# Patient Record
Sex: Female | Born: 1985 | Hispanic: Yes | Marital: Single | Smoking: Never smoker
Health system: Southern US, Community
[De-identification: ages and names within clinical notes are randomized; demographics above are authoritative.]

---

## 2009-04-23 ENCOUNTER — Emergency Department (HOSPITAL_COMMUNITY): Admission: EM | Admit: 2009-04-23 | Discharge: 2009-04-23 | Payer: Self-pay | Admitting: Emergency Medicine

## 2009-04-25 ENCOUNTER — Emergency Department (HOSPITAL_COMMUNITY): Admission: EM | Admit: 2009-04-25 | Discharge: 2009-04-25 | Payer: Self-pay | Admitting: Emergency Medicine

## 2009-04-27 ENCOUNTER — Emergency Department (HOSPITAL_COMMUNITY): Admission: EM | Admit: 2009-04-27 | Discharge: 2009-04-27 | Payer: Self-pay | Admitting: Emergency Medicine

## 2009-05-06 ENCOUNTER — Emergency Department (HOSPITAL_COMMUNITY): Admission: EM | Admit: 2009-05-06 | Discharge: 2009-05-06 | Payer: Self-pay | Admitting: Emergency Medicine

## 2010-06-21 LAB — WOUND CULTURE

## 2010-06-21 LAB — URINALYSIS, ROUTINE W REFLEX MICROSCOPIC
Protein, ur: NEGATIVE mg/dL
Specific Gravity, Urine: 1.024 (ref 1.005–1.030)
pH: 6 (ref 5.0–8.0)

## 2010-06-21 LAB — URINE MICROSCOPIC-ADD ON

## 2010-06-21 LAB — PREGNANCY, URINE: Preg Test, Ur: NEGATIVE

## 2012-10-04 ENCOUNTER — Emergency Department (HOSPITAL_COMMUNITY)
Admission: EM | Admit: 2012-10-04 | Discharge: 2012-10-04 | Disposition: A | Discharge status: Home / Self Care | Payer: Self-pay | Attending: Emergency Medicine | Admitting: Emergency Medicine

## 2012-10-04 ENCOUNTER — Encounter (HOSPITAL_COMMUNITY): Payer: Self-pay | Admitting: Family Medicine

## 2012-10-04 ENCOUNTER — Emergency Department (HOSPITAL_COMMUNITY): Payer: Self-pay

## 2012-10-04 DIAGNOSIS — R109 Unspecified abdominal pain: Secondary | ICD-10-CM | POA: Insufficient documentation

## 2012-10-04 DIAGNOSIS — Z3202 Encounter for pregnancy test, result negative: Secondary | ICD-10-CM | POA: Insufficient documentation

## 2012-10-04 DIAGNOSIS — M549 Dorsalgia, unspecified: Secondary | ICD-10-CM | POA: Insufficient documentation

## 2012-10-04 DIAGNOSIS — R6883 Chills (without fever): Secondary | ICD-10-CM | POA: Insufficient documentation

## 2012-10-04 LAB — CBC WITH DIFFERENTIAL/PLATELET
Basophils Absolute: 0.1 10*3/uL (ref 0.0–0.1)
HCT: 40.5 % (ref 36.0–46.0)
Lymphocytes Relative: 35 % (ref 12–46)
Lymphs Abs: 2.4 10*3/uL (ref 0.7–4.0)
MCH: 29.4 pg (ref 26.0–34.0)
MCHC: 34.8 g/dL (ref 30.0–36.0)
MCV: 84.4 fL (ref 78.0–100.0)
Monocytes Relative: 12 % (ref 3–12)
Neutro Abs: 3.3 10*3/uL (ref 1.7–7.7)
Neutrophils Relative %: 49 % (ref 43–77)
Platelets: 258 10*3/uL (ref 150–400)

## 2012-10-04 LAB — BASIC METABOLIC PANEL
BUN: 4 mg/dL — ABNORMAL LOW (ref 6–23)
CO2: 28 mEq/L (ref 19–32)
Chloride: 102 mEq/L (ref 96–112)
GFR calc Af Amer: >90 mL/min (ref >90)
Glucose, Bld: 95 mg/dL (ref 70–99)
Potassium: 4.2 mEq/L (ref 3.5–5.1)

## 2012-10-04 LAB — URINALYSIS, ROUTINE W REFLEX MICROSCOPIC
Bilirubin Urine: NEGATIVE
Glucose, UA: NEGATIVE mg/dL
Hgb urine dipstick: NEGATIVE
Ketones, ur: NEGATIVE mg/dL
Leukocytes, UA: NEGATIVE
pH: 6.5 (ref 5.0–8.0)

## 2012-10-04 MED ORDER — SULFAMETHOXAZOLE-TRIMETHOPRIM 800-160 MG PO TABS: 1.0000 | ORAL_TABLET | Freq: Two times a day (BID) | ORAL | Status: DC

## 2012-10-04 MED ORDER — IOHEXOL 300 MG/ML  SOLN
100.0000 mL | Freq: Once | INTRAMUSCULAR | Status: AC | PRN
  Administered 2012-10-04: 80 mL via INTRAVENOUS

## 2012-10-04 MED ORDER — SODIUM CHLORIDE 0.9 % IV BOLUS (SEPSIS)
1000.0000 mL | Freq: Once | INTRAVENOUS | Status: AC
  Administered 2012-10-04: 1000 mL via INTRAVENOUS

## 2012-10-04 NOTE — ED Provider Notes (Signed)
Medical screening examination/treatment/procedure(s) were performed by non-physician practitioner and as supervising physician I was immediately available for consultation/collaboration.   Gilda Crease, MD 10/04/12 [phone]

## 2012-10-04 NOTE — ED Notes (Signed)
Per interpretor phones pt was dx with kidney infection last week. sts has been taking abx and not getting any better. sts chills, achy. Denies specific abdominal pain. sts upper back achy. sts knees achy.

## 2012-10-04 NOTE — ED Provider Notes (Signed)
History    CSN: [id number] Arrival date & time 10/04/12  1478  First MD Initiated Contact with Patient 10/04/12 [phone]     Chief Complaint  Patient presents with  . Chills  . Back Pain   (Consider location/radiation/quality/duration/timing/severity/associated sxs/prior Treatment) HPI Comments: Patient is a 27 year old female presented to the emergency department for back pain and chills for the last week. Patient states she went to a clinic last week and was given Cipro for a kidney infection. Patient states he has been taking antibiotic as prescribed but still feels is not getting any better she continues to have some chills and back pain. Patient states she's not having any pain right now at this time but when she gets the pain she describes it as achy and nonradiating. Patient denies any dysuria, hematuria, urgency, frequency, fever, diarrhea, rash, abdominal pain.  The history is provided by the patient. A language interpreter was used.     History reviewed. No pertinent past medical history. History reviewed. No pertinent past surgical history. History reviewed. No pertinent family history. History  Substance Use Topics  . Smoking status: Never Smoker   . Smokeless tobacco: Not on file  . Alcohol Use: No   OB History   Grav Para Term Preterm Abortions TAB SAB Ect Mult Living                 Review of Systems  Constitutional: Positive for chills. Negative for fever.  Respiratory: Negative for shortness of breath.   Cardiovascular: Negative for chest pain.  Gastrointestinal: Negative for nausea, vomiting, abdominal pain and diarrhea.  Genitourinary: Positive for flank pain. Negative for dysuria and urgency.  Musculoskeletal: Positive for back pain.  Skin: Negative.   All other systems reviewed and are negative.    Allergies  Review of patient's allergies indicates no known allergies.  Home Medications   Current Outpatient Rx  Name  Route  Sig  Dispense  Refill  .  ciprofloxacin (CIPRO) 500 MG tablet   Oral   Take 500 mg by mouth 2 (two) times daily. starting 09/28/12 for 7 days          BP 138/82  Pulse 77  Temp(Src) 98.4 F (36.9 C)  Resp 18  SpO2 100%  LMP 09/19/2012 Physical Exam  Constitutional: She is oriented to person, place, and time. She appears well-developed and well-nourished.  HENT:  Head: Normocephalic and atraumatic.  Eyes: Conjunctivae are normal.  Neck: Normal range of motion. Neck supple.  Cardiovascular: Normal rate, regular rhythm and normal heart sounds.   Pulmonary/Chest: Effort normal and breath sounds normal.  Abdominal: Soft. Bowel sounds are normal. She exhibits no distension. There is no tenderness. There is CVA tenderness. There is no rebound and no guarding.  Neurological: She is alert and oriented to person, place, and time.  Skin: Skin is warm and dry.  Psychiatric: She has a normal mood and affect.    ED Course  Procedures (including critical care time) Labs Reviewed  BASIC METABOLIC PANEL - Abnormal; Notable for the following:    BUN 4 (*)    Creatinine, Ser 0.47 (*)    All other components within normal limits  URINALYSIS, ROUTINE W REFLEX MICROSCOPIC - Abnormal; Notable for the following:    Specific Gravity, Urine 1.003 (*)    All other components within normal limits  CBC WITH DIFFERENTIAL  PREGNANCY, URINE   Ct Abdomen Pelvis W Contrast  10/04/2012   *RADIOLOGY REPORT*  Clinical Data: Upper back  and bilateral flank pain.  Burning with urination, chills.  CT ABDOMEN AND PELVIS WITH CONTRAST  Technique:  Multidetector CT imaging of the abdomen and pelvis was performed following the standard protocol during bolus administration of intravenous contrast.  Contrast: 80mL OMNIPAQUE IOHEXOL 300 MG/ML  SOLN  Comparison: None.  Findings: Lung bases show no acute findings.  Heart size normal. No pericardial or pleural effusion.  Liver, gallbladder, adrenal glands, kidneys, spleen, pancreas, stomach and bowel  are unremarkable.  Appendix is normal.  Uterus and ovaries are visualized.  No pathologically enlarged lymph nodes.  No free fluid.  No worrisome lytic or sclerotic lesions.  IMPRESSION: No acute findings.  No evidence of pyelonephritis.   Original Report Authenticated By: Leanna Battles, M.D.   1. Left flank pain   2. Chills (without fever)     MDM  Patient is nontoxic, nonseptic appearing, in no apparent distress.  Patient having no pain in ED.  Fluid bolus given.  Labs, imaging and vitals reviewed.  Patient does not meet the SIRS or Sepsis criteria.  On repeat exam patient does not have a surgical abdomen and there are nor peritoneal signs.  No indication of appendicitis, bowel obstruction, bowel perforation, cholecystitis, diverticulitis, or ectopic pregnancy.  Patient most likely having residual pain from original infection. Patient will be switched to from Cipro d/t continued symptoms.  I have also discussed reasons to return immediately to the ER.  Patient expresses understanding and agrees with plan. Patient d/w with Dr. Blinda Leatherwood, agrees with plan. Patient is stable at time of discharge.           Jeannetta Ellis, PA-C 10/04/12 1335

## 2012-10-25 ENCOUNTER — Emergency Department (HOSPITAL_COMMUNITY)
Admission: EM | Admit: 2012-10-25 | Discharge: 2012-10-25 | Disposition: A | Discharge status: Home / Self Care | Payer: Self-pay | Attending: Emergency Medicine | Admitting: Emergency Medicine

## 2012-10-25 DIAGNOSIS — R300 Dysuria: Secondary | ICD-10-CM | POA: Insufficient documentation

## 2012-10-25 DIAGNOSIS — Z3202 Encounter for pregnancy test, result negative: Secondary | ICD-10-CM | POA: Insufficient documentation

## 2012-10-25 DIAGNOSIS — M545 Low back pain, unspecified: Secondary | ICD-10-CM | POA: Insufficient documentation

## 2012-10-25 DIAGNOSIS — M549 Dorsalgia, unspecified: Secondary | ICD-10-CM | POA: Diagnosis present

## 2012-10-25 LAB — URINALYSIS, ROUTINE W REFLEX MICROSCOPIC
Bilirubin Urine: NEGATIVE
Glucose, UA: NEGATIVE mg/dL
Ketones, ur: NEGATIVE mg/dL
Urobilinogen, UA: 0.2 mg/dL (ref 0.0–1.0)

## 2012-10-25 MED ORDER — ACETAMINOPHEN 325 MG PO TABS
650.0000 mg | ORAL_TABLET | Freq: Once | ORAL | Status: AC
  Administered 2012-10-25: 650 mg via ORAL
  Filled 2012-10-25: qty 2

## 2012-10-25 MED ORDER — IBUPROFEN 400 MG PO TABS: 400.0000 mg | ORAL_TABLET | Freq: Four times a day (QID) | ORAL | Status: DC | PRN

## 2012-10-25 NOTE — ED Provider Notes (Signed)
History    CSN: [id number] Arrival date & time 10/25/12  4098  First MD Initiated Contact with Patient 10/25/12 0809     Chief Complaint  Patient presents with  . Back Pain   (Consider location/radiation/quality/duration/timing/severity/associated sxs/prior Treatment) HPI Linda Payne 27 y.o. who was recently treated for UTI with Bactrim and has completed a course of antibiotics presents with a burning sensation on the left lower aspect of her back. The burning is been there for approximately 7-10 days. Unchanged from when it started. She reports that it wakes her up in the morning. No known exacerbating or relieving factors. This is mild in severity. Patient has not tried anything for the sensation. She reports being compliant with her Bactrim. She denies bowel or bladder incontinence. She denies lower extremity weakness or numbness. She does report some dysuria but indicated this started after she started to antibiotics.  No past medical history on file. No past surgical history on file. No family history on file. History  Substance Use Topics  . Smoking status: Never Smoker   . Smokeless tobacco: Not on file  . Alcohol Use: No   OB History   Grav Para Term Preterm Abortions TAB SAB Ect Mult Living                 Review of Systems  Constitutional: Negative for fever, chills, diaphoresis, activity change and appetite change.  HENT: Negative for sore throat, rhinorrhea, sneezing, drooling and trouble swallowing.   Eyes: Negative for discharge and redness.  Respiratory: Negative for cough, chest tightness, shortness of breath, wheezing and stridor.   Cardiovascular: Negative for chest pain and leg swelling.  Gastrointestinal: Negative for nausea, vomiting, abdominal pain, diarrhea, constipation and blood in stool.  Genitourinary: Positive for dysuria. Negative for difficulty urinating.  Musculoskeletal: Positive for back pain. Negative for myalgias and arthralgias.   Skin: Negative for pallor.  Neurological: Negative for dizziness, syncope, speech difficulty, weakness, light-headedness and headaches.  Hematological: Negative for adenopathy. Does not bruise/bleed easily.  Psychiatric/Behavioral: Negative for confusion and agitation.    Allergies  Review of patient's allergies indicates no known allergies.  Home Medications   Current Outpatient Rx  Name  Route  Sig  Dispense  Refill  . ibuprofen (ADVIL,MOTRIN) 400 MG tablet   Oral   Take 1 tablet (400 mg total) by mouth every 6 (six) hours as needed for pain.   30 tablet   0    BP 122/85  Pulse 97  Temp(Src) 100.3 F (37.9 C) (Oral)  Resp 14  SpO2 100%  LMP 09/19/2012 Physical Exam  Constitutional: She is oriented to person, place, and time. She appears well-developed and well-nourished. No distress.  HENT:  Head: Normocephalic and atraumatic.  Right Ear: External ear normal.  Left Ear: External ear normal.  Eyes: Conjunctivae and EOM are normal. Right eye exhibits no discharge. Left eye exhibits no discharge.  Neck: Normal range of motion. Neck supple. No JVD present.  Cardiovascular: Normal rate, regular rhythm and normal heart sounds.  Exam reveals no gallop and no friction rub.   No murmur heard. Pulmonary/Chest: Effort normal and breath sounds normal. No stridor. No respiratory distress. She has no wheezes. She has no rales. She exhibits no tenderness.  Abdominal: Soft. Bowel sounds are normal. She exhibits no distension. There is no tenderness. There is no rigidity, no rebound, no guarding, no CVA tenderness, no tenderness at McBurney's point and negative Murphy's sign.  Musculoskeletal: Normal range of motion. She exhibits  no edema.       Lumbar back: She exhibits tenderness (Left paraspinous). She exhibits normal range of motion, no bony tenderness, no swelling and no spasm.  Neurological: She is alert and oriented to person, place, and time. She has normal strength. No cranial  nerve deficit (3 through 12 grossly intact bilaterally). GCS eye subscore is 4. GCS verbal subscore is 5. GCS motor subscore is 6.  Grip strength 5/5 bilaterally Elbow flex/ext strength 5/5 bilaterally Hip flex/ext strength 5/5 bilaterally Plantar/dorsiflexion strength 5/5 bilaterally  Skin: Skin is warm. No rash noted. She is not diaphoretic.  Psychiatric: She has a normal mood and affect. Her behavior is normal.    ED Course  Procedures (including critical care time) Labs Reviewed  URINALYSIS, ROUTINE W REFLEX MICROSCOPIC  PREGNANCY, URINE   Results for orders placed during the hospital encounter of 10/25/12  URINALYSIS, ROUTINE W REFLEX MICROSCOPIC      Result Value Range   Color, Urine YELLOW  YELLOW   APPearance CLEAR  CLEAR   Specific Gravity, Urine 1.005  1.005 - 1.030   pH 7.0  5.0 - 8.0   Glucose, UA NEGATIVE  NEGATIVE mg/dL   Hgb urine dipstick NEGATIVE  NEGATIVE   Bilirubin Urine NEGATIVE  NEGATIVE   Ketones, ur NEGATIVE  NEGATIVE mg/dL   Protein, ur NEGATIVE  NEGATIVE mg/dL   Urobilinogen, UA 0.2  0.0 - 1.0 mg/dL   Nitrite NEGATIVE  NEGATIVE   Leukocytes, UA NEGATIVE  NEGATIVE  PREGNANCY, URINE      Result Value Range   Preg Test, Ur NEGATIVE  NEGATIVE    No results found. 1. Back pain     MDM  Rowan Blase 27 y.o. presents for back pain. Patient was recently treated for UTI with 10 day course of Bactrim. She reports ongoing dysuria as well. No bowel or bladder incontinence. No precipitating injury noted. Afebrile vital signs stable. No acute distress. No CVA tenderness. No signs or symptoms of peritonitis. No midline spinal tenderness to palpation. No evidence of a rash. Will check UA and urine pregnancy. Gave Tylenol for pain. Will re evaluate.  UA negative. Urine pregnancy negative. Back pain likely musculoskeletal. She was given ibuprofen for pain. Was instructed to use either present as needed for pain. She is instructed to follow with PCP for  further management symptoms continue. She is given strong return precautions.  Labs reviewed. I discussed this patient's care with my attending, Dr. Dan Humphreys.  Sena Hitch, MD 10/25/12 1640

## 2012-10-25 NOTE — ED Notes (Signed)
Per report from pt she states that 2 weeks ago she began to have a burning sensation in her mid back.  It increases when she bends over and stands up.  Denies any CP or SOB.  No distress noted.  States that she was evaluated 2 weeks ago and was discharged with "pills" but they didn't help and she did not follow up with a primary MD.

## 2012-10-25 NOTE — ED Provider Notes (Signed)
I saw and evaluated the patient, reviewed the resident's note and I agree with the findings and plan.   Kaydan Wong Ann Felicity Penix, MD 10/25/12 1812 

## 2016-04-04 NOTE — L&D Delivery Note (Signed)
Delivery Note At 3:52 AM a viable female was delivered via  (Presentation:LOA).  APGAR:8, 9.   Placenta status: delivered whole and intact with 3-vessel cord without complications  Anesthesia:  none Episiotomy:  none Lacerations:  First degree Suture Repair: 2.0 vicryl rapide Est. Blood Loss (mL):  200  Mom to postpartum.  Baby to Couplet care / Skin to Skin.  [name] 01/22/2017, 4:08 AM

## 2016-06-25 ENCOUNTER — Encounter (HOSPITAL_COMMUNITY): Payer: Self-pay

## 2016-06-25 ENCOUNTER — Emergency Department (HOSPITAL_COMMUNITY): Payer: Self-pay

## 2016-06-25 ENCOUNTER — Emergency Department (HOSPITAL_COMMUNITY)
Admission: EM | Admit: 2016-06-25 | Discharge: 2016-06-26 | Disposition: A | Discharge status: Home / Self Care | Payer: Self-pay | Attending: Emergency Medicine | Admitting: Emergency Medicine

## 2016-06-25 DIAGNOSIS — W260XXA Contact with knife, initial encounter: Secondary | ICD-10-CM | POA: Insufficient documentation

## 2016-06-25 DIAGNOSIS — Y999 Unspecified external cause status: Secondary | ICD-10-CM | POA: Insufficient documentation

## 2016-06-25 DIAGNOSIS — S61432A Puncture wound without foreign body of left hand, initial encounter: Secondary | ICD-10-CM

## 2016-06-25 DIAGNOSIS — Y92009 Unspecified place in unspecified non-institutional (private) residence as the place of occurrence of the external cause: Secondary | ICD-10-CM | POA: Insufficient documentation

## 2016-06-25 DIAGNOSIS — Y93G3 Activity, cooking and baking: Secondary | ICD-10-CM | POA: Insufficient documentation

## 2016-06-25 MED ORDER — TETANUS-DIPHTH-ACELL PERTUSSIS 5-2.5-18.5 LF-MCG/0.5 IM SUSP
0.5000 mL | Freq: Once | INTRAMUSCULAR | Status: AC
  Administered 2016-06-25: 0.5 mL via INTRAMUSCULAR
  Filled 2016-06-25: qty 0.5

## 2016-06-25 MED ORDER — LIDOCAINE HCL (PF) 1 % IJ SOLN
5.0000 mL | Freq: Once | INTRAMUSCULAR | Status: AC
  Administered 2016-06-25: 5 mL
  Filled 2016-06-25: qty 5
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
## 2016-06-25 NOTE — ED Notes (Signed)
Pt's injured hand soaked in betadine and saline solution
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
## 2016-06-26 MED ORDER — IBUPROFEN 600 MG PO TABS: 600.0000 mg | ORAL_TABLET | Freq: Four times a day (QID) | ORAL | 0 refills | Status: DC | PRN

## 2016-06-26 MED ORDER — CEPHALEXIN 500 MG PO CAPS: 500.0000 mg | ORAL_CAPSULE | Freq: Two times a day (BID) | ORAL | 0 refills | Status: DC

## 2016-06-26 NOTE — ED Notes (Signed)
Pt stable, ambulatory, states understanding of discharge instructions 

## 2016-06-26 NOTE — Discharge Instructions (Signed)
Please read and follow all provided instructions.  Your diagnoses today include:  1. Puncture wound of left hand without foreign body, initial encounter     Tests performed today include: X-ray of the affected area that did not show any foreign bodies or broken bones Vital signs. See below for your results today.   Medications prescribed:   Take any prescribed medications only as directed.   Home care instructions:  Follow any educational materials and wound care instructions contained in this packet.   You may shower and wash the area with soap and water, just be sure to pat the area dry and not rub over the stitches. Do no put your stiches underwater (in a bath, pool, or lake). Getting stiches wet can slow down healing and increase your chances of getting an infection. You may apply Bacitracin or Neosporin twice a day for 7 days, and keep the ara clean with  bandage or gauze. Do not apply alcohol or hydrogen peroxide. Cover the area if it draining or weeping.   Follow-up instructions: Suture Removal: Return to the Emergency Department or see your primary care care doctor in 8-10 days for a recheck of your wound and removal of your sutures or staples.    Return instructions:  Return to the Emergency Department if you have: Fever Worsening pain Worsening swelling of the wound Pus draining from the wound Redness of the skin that moves away from the wound, especially if it streaks away from the affected area  Any other emergent concerns  Your vital signs today were: BP 126/77 (BP Location: Right Arm)    Pulse 87    Temp 98.2 F (36.8 C) (Oral)    Resp 18    LMP 06/16/2016 (Approximate)    SpO2 99%  If your blood pressure (BP) was elevated above 135/85 this visit, please have this repeated by your doctor within one month. --------------

## 2016-07-01 ENCOUNTER — Other Ambulatory Visit: Payer: Self-pay

## 2016-07-01 ENCOUNTER — Ambulatory Visit (INDEPENDENT_AMBULATORY_CARE_PROVIDER_SITE_OTHER): Payer: Self-pay | Admitting: Physician Assistant

## 2016-07-01 VITALS — BP 128/89 | HR 77 | Temp 98.6°F | Resp 16 | Ht <= 58 in | Wt 136.0 lb

## 2016-07-01 DIAGNOSIS — S61412A Laceration without foreign body of left hand, initial encounter: Secondary | ICD-10-CM

## 2016-07-01 DIAGNOSIS — Z4802 Encounter for removal of sutures: Secondary | ICD-10-CM

## 2016-07-01 MED ORDER — CEPHALEXIN 500 MG PO CAPS: 500.0000 mg | ORAL_CAPSULE | Freq: Two times a day (BID) | ORAL | 0 refills | Status: AC

## 2016-07-01 MED ORDER — CEPHALEXIN 500 MG PO CAPS: 500.0000 mg | ORAL_CAPSULE | Freq: Two times a day (BID) | ORAL | 0 refills | Status: DC

## 2016-07-01 NOTE — Patient Instructions (Signed)
     IF you received an x-ray today, you will receive an invoice from [facility]. Please contact [facility] at [phone] with questions or concerns regarding your invoice.   IF you received labwork today, you will receive an invoice from [facility]. Please contact [facility] at [phone] with questions or concerns regarding your invoice.   Our billing staff will not be able to assist you with questions regarding bills from these companies.  You will be contacted with the lab results as soon as they are available. The fastest way to get your results is to activate your My Chart account. Instructions are located on the last page of this paperwork. If you have not heard from us regarding the results in 2 weeks, please contact this office.     

## 2016-07-02 NOTE — Progress Notes (Signed)
PRIMARY CARE AT [facility] [address], [place] [address] [phone]  Date:  07/01/2016   Name:  Linda Payne   DOB:  11-17-1985   MRN:  [id number]  PCP:  No PCP Per Patient    History of Present Illness:  Linda Payne is a 31 y.o. female patient who presents to PCP with  Chief Complaint  Patient presents with  . Suture / Staple Removal    Left hand     Patient is here today for suture removal.  She was seen at the ED 6 days ago for a laceration.  Suture repair and placed on keflex.  Patient reports that there is improvement without drainage, or erythematous swelling.  It is tender at the left hand.  Patient Active Problem List   Diagnosis Date Noted  . Back pain 10/25/2012    No past medical history on file.  No past surgical history on file.  Social History  Substance Use Topics  . Smoking status: Never Smoker  . Smokeless tobacco: Never Used  . Alcohol use No    No family history on file.  No Known Allergies  Medication list has been reviewed and updated.  Current Outpatient Prescriptions on File Prior to Visit  Medication Sig Dispense Refill  . ibuprofen (ADVIL,MOTRIN) 600 MG tablet Take 1 tablet (600 mg total) by mouth every 6 (six) hours as needed. 30 tablet 0   No current facility-administered medications on file prior to visit.     ROS ROS otherwise unremarkable unless listed above.  Physical Examination: BP 128/89   Pulse 77   Temp 98.6 F (37 C) (Oral)   Resp 16   Ht 4' 7.75" (1.416 m)   Wt 136 lb (61.7 kg)   LMP 06/16/2016 (Approximate)   SpO2 96%   BMI 30.76 kg/m  Ideal Body Weight: Weight in (lb) to have BMI = 25: 110.3  Physical Exam  Constitutional: She is oriented to person, place, and time. She appears well-developed and well-nourished. No distress.  HENT:  Head: Normocephalic and atraumatic.  Right Ear: External ear normal.  Left Ear: External ear normal.  Eyes: Conjunctivae and EOM are normal. Pupils are  equal, round, and reactive to light.  Cardiovascular: Normal rate.   Pulmonary/Chest: Effort normal. No respiratory distress.  Neurological: She is alert and oriented to person, place, and time.  Skin: She is not diaphoretic.  Palm of hand with suture intact at the .5 cm laceration.  Mild localized erythema without drainage.  Normal rom of digits.  Psychiatric: She has a normal mood and affect. Her behavior is normal.     Assessment and Plan: Linda Payne is a 31 y.o. female who is here today  Suture removed without complication.  Will add 3 more days of keflex to a 10 day completion.  Advised wound care.   Laceration of left hand without foreign body, initial encounter  Visit for suture removal  Trena Platt, PA-C [facility] 3/31/201811:22 PM

## 2016-12-01 LAB — OB RESULTS CONSOLE ANTIBODY SCREEN: ANTIBODY SCREEN: NEGATIVE

## 2016-12-01 LAB — OB RESULTS CONSOLE HEPATITIS B SURFACE ANTIGEN: Hepatitis B Surface Ag: NEGATIVE

## 2016-12-01 LAB — OB RESULTS CONSOLE ABO/RH: RH Type: POSITIVE

## 2016-12-01 LAB — OB RESULTS CONSOLE GC/CHLAMYDIA
CHLAMYDIA, DNA PROBE: NEGATIVE
GC PROBE AMP, GENITAL: NEGATIVE

## 2016-12-01 LAB — OB RESULTS CONSOLE RUBELLA ANTIBODY, IGM: Rubella: IMMUNE

## 2016-12-01 LAB — OB RESULTS CONSOLE HIV ANTIBODY (ROUTINE TESTING): HIV: NONREACTIVE

## 2017-01-09 LAB — OB RESULTS CONSOLE GBS: STREP GROUP B AG: NEGATIVE

## 2017-01-22 ENCOUNTER — Encounter (HOSPITAL_COMMUNITY): Payer: Self-pay | Admitting: *Deleted

## 2017-01-22 ENCOUNTER — Inpatient Hospital Stay (HOSPITAL_COMMUNITY)
Admission: AD | Admit: 2017-01-22 | Discharge: 2017-01-24 | DRG: 807 | Disposition: A | Discharge status: Home / Self Care | Payer: Medicaid Other | Source: Ambulatory Visit | Attending: Obstetrics and Gynecology | Admitting: Obstetrics and Gynecology

## 2017-01-22 DIAGNOSIS — Z3A39 39 weeks gestation of pregnancy: Secondary | ICD-10-CM

## 2017-01-22 DIAGNOSIS — R87610 Atypical squamous cells of undetermined significance on cytologic smear of cervix (ASC-US): Secondary | ICD-10-CM | POA: Insufficient documentation

## 2017-01-22 DIAGNOSIS — O26893 Other specified pregnancy related conditions, third trimester: Secondary | ICD-10-CM | POA: Diagnosis present

## 2017-01-22 DIAGNOSIS — R87810 Cervical high risk human papillomavirus (HPV) DNA test positive: Secondary | ICD-10-CM

## 2017-01-22 LAB — CBC
HEMATOCRIT: 37.4 % (ref 36.0–46.0)
HEMOGLOBIN: 12.8 g/dL (ref 12.0–15.0)
MCH: 29.5 pg (ref 26.0–34.0)
MCHC: 34.2 g/dL (ref 30.0–36.0)
MCV: 86.2 fL (ref 78.0–100.0)
Platelets: 206 10*3/uL (ref 150–400)
RBC: 4.34 MIL/uL (ref 3.87–5.11)
RDW: 16.5 % — AB (ref 11.5–15.5)
WBC: 16 10*3/uL — ABNORMAL HIGH (ref 4.0–10.5)

## 2017-01-22 LAB — ABO/RH: ABO/RH(D): O POS

## 2017-01-22 LAB — POCT FERN TEST: POCT FERN TEST: POSITIVE

## 2017-01-22 LAB — RPR: RPR Ser Ql: NONREACTIVE

## 2017-01-22 LAB — TYPE AND SCREEN
ABO/RH(D): O POS
Antibody Screen: NEGATIVE

## 2017-01-22 MED ORDER — TETANUS-DIPHTH-ACELL PERTUSSIS 5-2.5-18.5 LF-MCG/0.5 IM SUSP: 0.5000 mL | Freq: Once | INTRAMUSCULAR | Status: DC

## 2017-01-22 MED ORDER — OXYCODONE-ACETAMINOPHEN 5-325 MG PO TABS: 1.0000 | ORAL_TABLET | ORAL | Status: DC | PRN

## 2017-01-22 MED ORDER — MISOPROSTOL 200 MCG PO TABS
800.0000 ug | ORAL_TABLET | Freq: Once | ORAL | Status: AC
  Administered 2017-01-22: 800 ug via BUCCAL

## 2017-01-22 MED ORDER — SODIUM CHLORIDE 0.9 % IV SOLN
1.5000 g | Freq: Four times a day (QID) | INTRAVENOUS | Status: AC
  Administered 2017-01-22 – 2017-01-23 (×4): 1.5 g via INTRAVENOUS
  Filled 2017-01-22 (×4): qty 1.5

## 2017-01-22 MED ORDER — SIMETHICONE 80 MG PO CHEW: 80.0000 mg | CHEWABLE_TABLET | ORAL | Status: DC | PRN

## 2017-01-22 MED ORDER — METHYLERGONOVINE MALEATE 0.2 MG/ML IJ SOLN
INTRAMUSCULAR | Status: AC
  Administered 2017-01-22: 0.2 mg
  Filled 2017-01-22: qty 1

## 2017-01-22 MED ORDER — ONDANSETRON HCL 4 MG/2ML IJ SOLN: 4.0000 mg | INTRAMUSCULAR | Status: DC | PRN

## 2017-01-22 MED ORDER — SOD CITRATE-CITRIC ACID 500-334 MG/5ML PO SOLN: 30.0000 mL | ORAL | Status: DC | PRN

## 2017-01-22 MED ORDER — DIPHENHYDRAMINE HCL 25 MG PO CAPS: 25.0000 mg | ORAL_CAPSULE | Freq: Four times a day (QID) | ORAL | Status: DC | PRN

## 2017-01-22 MED ORDER — METHYLERGONOVINE MALEATE 0.2 MG/ML IJ SOLN
0.2000 mg | Freq: Once | INTRAMUSCULAR | Status: AC
Start: 2017-01-22 — End: 2017-01-22
  Administered 2017-01-22: 0.2 mg via INTRAMUSCULAR

## 2017-01-22 MED ORDER — OXYCODONE-ACETAMINOPHEN 5-325 MG PO TABS: 2.0000 | ORAL_TABLET | ORAL | Status: DC | PRN

## 2017-01-22 MED ORDER — GENTAMICIN SULFATE 40 MG/ML IJ SOLN
5.0000 mg/kg | INTRAMUSCULAR | Status: AC
  Administered 2017-01-22: 330 mg via INTRAVENOUS
  Filled 2017-01-22: qty 8.25

## 2017-01-22 MED ORDER — DIBUCAINE 1 % RE OINT: 1.0000 "application " | TOPICAL_OINTMENT | RECTAL | Status: DC | PRN

## 2017-01-22 MED ORDER — IBUPROFEN 600 MG PO TABS
600.0000 mg | ORAL_TABLET | Freq: Four times a day (QID) | ORAL | Status: DC
  Administered 2017-01-22 – 2017-01-24 (×7): 600 mg via ORAL
  Filled 2017-01-22 (×8): qty 1

## 2017-01-22 MED ORDER — WITCH HAZEL-GLYCERIN EX PADS
1.0000 | MEDICATED_PAD | CUTANEOUS | Status: DC | PRN
Start: 2017-01-22 — End: 2017-01-24

## 2017-01-22 MED ORDER — PRENATAL MULTIVITAMIN CH
1.0000 | ORAL_TABLET | Freq: Every day | ORAL | Status: DC
  Administered 2017-01-22 – 2017-01-23 (×2): 1 via ORAL
  Filled 2017-01-22 (×2): qty 1

## 2017-01-22 MED ORDER — LIDOCAINE HCL (PF) 1 % IJ SOLN
INTRAMUSCULAR | Status: AC
  Filled 2017-01-22: qty 30

## 2017-01-22 MED ORDER — BENZOCAINE-MENTHOL 20-0.5 % EX AERO: 1.0000 "application " | INHALATION_SPRAY | CUTANEOUS | Status: DC | PRN

## 2017-01-22 MED ORDER — LACTATED RINGERS IV SOLN
INTRAVENOUS | Status: DC
  Administered 2017-01-22: 03:00:00 via INTRAVENOUS

## 2017-01-22 MED ORDER — COCONUT OIL OIL: 1.0000 "application " | TOPICAL_OIL | Status: DC | PRN

## 2017-01-22 MED ORDER — LIDOCAINE HCL (PF) 1 % IJ SOLN
30.0000 mL | INTRAMUSCULAR | Status: DC | PRN
  Administered 2017-01-22: 30 mL via SUBCUTANEOUS
  Filled 2017-01-22: qty 30

## 2017-01-22 MED ORDER — ACETAMINOPHEN 325 MG PO TABS
650.0000 mg | ORAL_TABLET | ORAL | Status: DC | PRN
  Administered 2017-01-22: 650 mg via ORAL
  Filled 2017-01-22: qty 2

## 2017-01-22 MED ORDER — OXYTOCIN 40 UNITS IN LACTATED RINGERS INFUSION - SIMPLE MED
2.5000 [IU]/h | INTRAVENOUS | Status: DC
  Administered 2017-01-22: 2.5 [IU]/h via INTRAVENOUS

## 2017-01-22 MED ORDER — OXYTOCIN 40 UNITS IN LACTATED RINGERS INFUSION - SIMPLE MED
INTRAVENOUS | Status: AC
  Filled 2017-01-22: qty 1000

## 2017-01-22 MED ORDER — OXYTOCIN BOLUS FROM INFUSION
500.0000 mL | Freq: Once | INTRAVENOUS | Status: AC
  Administered 2017-01-22: 500 mL via INTRAVENOUS

## 2017-01-22 MED ORDER — ONDANSETRON HCL 4 MG PO TABS: 4.0000 mg | ORAL_TABLET | ORAL | Status: DC | PRN

## 2017-01-22 MED ORDER — SENNOSIDES-DOCUSATE SODIUM 8.6-50 MG PO TABS
2.0000 | ORAL_TABLET | ORAL | Status: DC
  Administered 2017-01-24: 2 via ORAL
  Filled 2017-01-22 (×2): qty 2

## 2017-01-22 MED ORDER — ONDANSETRON HCL 4 MG/2ML IJ SOLN: 4.0000 mg | Freq: Four times a day (QID) | INTRAMUSCULAR | Status: DC | PRN

## 2017-01-22 MED ORDER — MISOPROSTOL 200 MCG PO TABS
ORAL_TABLET | ORAL | Status: AC
  Administered 2017-01-22: 800 ug via BUCCAL
  Filled 2017-01-22: qty 5

## 2017-01-22 MED ORDER — LACTATED RINGERS IV SOLN: 500.0000 mL | INTRAVENOUS | Status: DC | PRN

## 2017-01-22 MED ORDER — ACETAMINOPHEN 325 MG PO TABS: 650.0000 mg | ORAL_TABLET | ORAL | Status: DC | PRN

## 2017-01-22 MED ORDER — FLEET ENEMA 7-19 GM/118ML RE ENEM: 1.0000 | ENEMA | RECTAL | Status: DC | PRN

## 2017-01-22 NOTE — H&P (Signed)
LABOR AND DELIVERY ADMISSION HISTORY AND PHYSICAL NOTE  Linda Payne is a 31 y.o. female G1P1000 with IUP at [redacted]w[redacted]d presenting for SOL and ROM at 12:30 am.  Prenatal History/Complications: PNC at  [facility] Pregnancy complications:  - Varicella non-immune - ASCUS pap, +HPV - Language barrier (Spanish)  Past Medical History: History reviewed. No pertinent past medical history.  Past Surgical History: History reviewed. No pertinent surgical history.  Obstetrical History: OB History    Gravida Para Term Preterm AB Living   1 1 1          SAB TAB Ectopic Multiple Live Births         0        Social History: Social History   Social History  . Marital status: Single    Spouse name: N/A  . Number of children: N/A  . Years of education: N/A   Social History Main Topics  . Smoking status: Never Smoker  . Smokeless tobacco: Never Used  . Alcohol use No  . Drug use: Unknown  . Sexual activity: Not Asked   Other Topics Concern  . None   Social History Narrative  . None    Family History: History reviewed. No pertinent family history.  Allergies: No Known Allergies  Prescriptions Prior to Admission  Medication Sig Dispense Refill Last Dose  . ibuprofen (ADVIL,MOTRIN) 600 MG tablet Take 1 tablet (600 mg total) by mouth every 6 (six) hours as needed. 30 tablet 0 Taking     Review of Systems  All systems reviewed and negative except as stated in HPI  Physical Exam Blood pressure 133/70, pulse 89, temperature 98.2 F (36.8 C), resp. rate 18, height 4\' 8"  (1.422 m), weight 147 lb (66.7 kg), last menstrual period 06/16/2016, unknown if currently breastfeeding. General appearance: uncomfortable with contractions Lungs: normal WOB Heart: regular rate  Abdomen: soft, gravid Extremities: No LE edema Presentation: cephalic Fetal monitoring: 145, moderate variability, +acel, no decel Uterine activity: ctx q1-2 min Dilation: Lip/rim Effacement (%): 100 Station:  +2 Exam by:: Bertram Millard, RN  Prenatal labs: ABO, Rh: O/Positive/-- (08/30 0000) Antibody: Negative (08/30 0000) Rubella: Immune (08/30 0000) RPR:   negative HBsAg: Negative (08/30 0000)  HIV: Non-reactive (08/30 0000)  GC/Chlamydia: negative GBS: Negative (10/08 0000)  1 hr Glucola: 116 Genetic screening:  Too late Anatomy US: normal visualized anatomy, but RVOT not visualized  Prenatal Transfer Tool  Maternal Diabetes: No Genetic Screening: n/a, too late Maternal Ultrasounds/Referrals: Normal Fetal Ultrasounds or other Referrals:  None Maternal Substance Abuse:  No Significant Maternal Medications:  None Significant Maternal Lab Results: None  Results for orders placed or performed during the hospital encounter of 01/22/17 (from the past 24 hour(s))  Fern Test   Collection Time: 01/22/17  2:52 AM  Result Value Ref Range   POCT Fern Test Positive = ruptured amniotic membanes   CBC   Collection Time: 01/22/17  3:26 AM  Result Value Ref Range   WBC 16.0 (H) 4.0 - 10.5 K/uL   RBC 4.34 3.87 - 5.11 MIL/uL   Hemoglobin 12.8 12.0 - 15.0 g/dL   HCT 16.1 09.6 - 04.5 %   MCV 86.2 78.0 - 100.0 fL   MCH 29.5 26.0 - 34.0 pg   MCHC 34.2 30.0 - 36.0 g/dL   RDW 40.9 (H) 81.1 - 91.4 %   Platelets 206 150 - 400 K/uL    Patient Active Problem List   Diagnosis Date Noted  . Normal labor 01/22/2017  . Back  pain 10/25/2012    Assessment: Linda Payne is a 31 y.o. G1P1000 at 3450w1d here for SOL with AROM at 12:30an,  #Labor: expectant management #Pain: natural #FWB: Cat I #ID:  GBS neg #MOF: breast #MOC:Depo #Circ:  N/a (girl)  Kandra NicolasJulie P Carmine Carrozza 01/22/2017

## 2017-01-22 NOTE — Progress Notes (Signed)
Called into room to evaluate PP bleeding. Patient had 200ml EBL with at delivery. RN reports brisk bleeding with fundal massage.   On my evaluation, pt NAD, VSS, BP 114/53, HR 90s. Brisk bleeding noted with fundal massage. On bimanual exam, lower uterine segmented noted to have poor tone and it was cleared of several large clots. Bimanual massage done and IM methergine given. Hemostasis noted. Patient left in stable condition. Total EBL now close 400 ml. Continue to monitor closely  Kandra NicolasJulie P. Degele, MD OB Fellow

## 2017-01-22 NOTE — MAU Note (Signed)
Possible rom at 0030, contractions X 1hr

## 2017-01-22 NOTE — Plan of Care (Signed)
Problem: Activity: Goal: Ability to tolerate increased activity will improve Outcome: Not Progressing Pt has not been out of bed today without encouragement.  Importance of ambulating has been discussed via interpreter.  Problem: Education: Goal: Knowledge of condition will improve Outcome: Progressing Pt states she has been educated on the admission packet.  Demos perineal care appropriately. All vaccinations have been given prior to delivery as applicable; not a candidate for pneumonia.  Problem: Coping: Goal: Ability to cope will improve Outcome: Not Progressing Pt has been sleeping all day.  She states she is fine, just tired, however she has not been observed interacting with infant.  Will con't to monitor. [facility]. Goal: Ability to identify and utilize available resources and services will improve Outcome: Progressing Interpreter used for education. Pt does understand some English.  Problem: Life Cycle: Goal: Risk for postpartum hemorrhage will decrease Outcome: Progressing Pt fundas has been firm & midline during assessment, however, bladder must be empty. Goal: Chance of risk for complications during the postpartum period will decrease Outcome: Progressing Fever up to 102.2 this morning.  Pt has been given Tylenol & antibiotics have been started.  Temp has been stable since 1300.  Problem: Nutritional: Goal: Mothers verbalization of comfort with breastfeeding process will improve Outcome: Not Progressing Pt has not breastfed today because she states she is tired.  She has been educated via interpreter the importance of stimulating breasts & nipple confusion.  She states she will let RN know if she plans to breastfeed.  Problem: Role Relationship: Goal: Ability to demonstrate positive interaction with newborn will improve Outcome: Not Progressing Patient has not interacted with infant during this shift.  She has been asleep & states that she is  just tired.  FOB has been held infant & been observed taking pics of infant.    Problem: Pain Management: Goal: General experience of comfort will improve and pain level will decrease Outcome: Progressing Patient pain has been managed adequately with scheduled meds.

## 2017-01-22 NOTE — Progress Notes (Signed)
Pt had on 5 covers plus cover on her head.  Covers removed, will recheck temp.

## 2017-01-22 NOTE — Progress Notes (Signed)
Report called to Dr Karen ChafeLockamy re: pt temps & meds given.  MD to place abx order.

## 2017-01-23 ENCOUNTER — Encounter (HOSPITAL_COMMUNITY): Payer: Self-pay | Admitting: Family Medicine

## 2017-01-23 LAB — CBC WITH DIFFERENTIAL/PLATELET
Basophils Absolute: 0.1 10*3/uL (ref 0.0–0.1)
Basophils Relative: 0 %
Eosinophils Absolute: 0.3 10*3/uL (ref 0.0–0.7)
Eosinophils Relative: 2 %
HEMATOCRIT: 33.3 % — AB (ref 36.0–46.0)
Hemoglobin: 11.4 g/dL — ABNORMAL LOW (ref 12.0–15.0)
LYMPHS ABS: 3.2 10*3/uL (ref 0.7–4.0)
LYMPHS PCT: 25 %
MCH: 29.5 pg (ref 26.0–34.0)
MCHC: 34.2 g/dL (ref 30.0–36.0)
MCV: 86.3 fL (ref 78.0–100.0)
MONO ABS: 0.8 10*3/uL (ref 0.1–1.0)
MONOS PCT: 6 %
NEUTROS ABS: 8.5 10*3/uL — AB (ref 1.7–7.7)
Neutrophils Relative %: 67 %
Platelets: 189 10*3/uL (ref 150–400)
RBC: 3.86 MIL/uL — ABNORMAL LOW (ref 3.87–5.11)
RDW: 17.4 % — AB (ref 11.5–15.5)
WBC: 12.9 10*3/uL — ABNORMAL HIGH (ref 4.0–10.5)

## 2017-01-23 NOTE — Clinical Social Work Maternal (Signed)
CLINICAL SOCIAL WORK MATERNAL/CHILD NOTE  Patient Details  Name: Linda Payne MRN: [id number] Date of Birth: 1985/11/07  Date:  01/23/2017  Clinical Social Worker Initiating Note:  Terri Piedra, [place] Date/Time: Initiated:  01/23/17/1510     Child's Name:  Deboraha Sprang   Biological Parents:  Mother, Father Rut [place] and [place])   Need for Interpreter:  Spanish   Reason for Referral:  Late or No Prenatal Care  [facility] at 30 weeks)   Address:  [place] [address]    Phone number:  [phone] (home)     Additional phone number:   Household Members/Support Persons (HM/SP):   Household Member/Support Person 1   HM/SP Name Relationship DOB or Age  HM/SP -Hardy    HM/SP -2        HM/SP -3        HM/SP -4        HM/SP -5        HM/SP -6        HM/SP -7        HM/SP -8          Natural Supports (not living in the home):  Other (Comment) (FOB reports that his boss/pastor is supportive.)   Professional Supports: None   Employment:     Type of Work: FOB works in Nature conservation officer but his work depends on the weather   Education:      Homebound arranged:    Pensions consultant:   (MOB plans to apply for [employer])   Other Resources:  [facility]   Cultural/Religious Considerations Which May Impact Care: None stated.  MOB's facesheet notes religion as Nurse, learning disability.  Strengths:  Ability to meet basic needs  (Parents were given a pediatrician list)   Psychotropic Medications:         Pediatrician:       [facility]      Pediatrician Fax Number:    Risk Factors/Current Problems:  None   Cognitive State:  Alert , Able to Concentrate , Linear Thinking , Insightful , Goal Oriented    Mood/Affect:  Interested , Calm , Other (Comment) (FOB appeared nervous as  evidenced by laughing when spoken to.)   CSW Assessment: CSW met with parents in MOB's first floor room/101 to offer support and complete assessment due to [facility] at 30 weeks.  CSW was unable to reach [employer], so utilized [employer] to communicate with couple.  MOB gave consent for CSW to speak openly with FOB present.  Parents were pleasant and welcoming, though quiet initially.  FOB seemed nervous, and laughed/smiled when he was spoken to.  Midway through conversation, hospital Spanish Interpreter entered the room and the assessment was completed with her assistance.   MOB reports that she did not know she was pregnant because she has always had irregular periods and had never been pregnant before, so didn't know what to expect.  She states she went to the [facility] for a pregnancy test and when it was positive, began [facility].  She states she had regular care once initiated.  Parents were understanding of hospital drug screen policy.  MOB states no questions or concerns.  Baby's UDS is negative. Parents deny all hx of mental illness and were attentive while CSW discussed  PMADs.  Both parents report feeling worried and somewhat anxious about baby's blood sugar level, but otherwise state no negative emotions or concerns at this time.   Parents state they are not prepared for baby at home because MOB states her due date was 02/06/17.  (CSW saw 01/28/17 noted in  [facility]).  CSW explained need for safe place to sleep and informed couple of baby box program.  They will watch videos and take test and let RN staff know when they have their completion code.  CSW asked Security officer to take a girl bundle pack from [employer] to [employer].  FOB states he is able to get diapers and some clothes with his income. Parents have not yet chosen a pediatrician yet, but state someone from [facility] has come to talk with them and is supposed to be bringing them a list.  They have limited transportation.  CSW informed  them of Medicaid transportation once baby has applied for Medicaid.  MOB was appreciative.   Parents report that their families are not here with them.  MOB's family is in [place] and Braman is in [place].  They state they are in contact and that their families are emotionally supportive.  CSW encouraged them to stay in contact and discussed the importance of family support, even if not physically present.  FOB states his boss is a Theme park manager and is a good support to the couple.  They state no further questions, concerns or needs at this time.   CSW Plan/Description:  No Further Intervention Required/No Barriers to Discharge, Perinatal Mood and Anxiety Disorder (PMADs) Education, [place], Other Information/Referral to [employer], CSW Will Continue to Monitor Umbilical Cord Tissue Drug Screen Results and Make Report if Barnetta Chapel 01/23/2017, 4:26 PM

## 2017-01-23 NOTE — Lactation Note (Signed)
This note was copied from a baby's chart. Lactation Consultation Note  Patient Name: Linda Payne RUEAV'WToday's Date: 01/23/2017   Linda Payne interpreter present for Spanish. Mother states she only wants to offer baby formula.     Maternal Data    Feeding Feeding Type: Bottle Fed - Formula Nipple Type: Slow - flow  LATCH Score                   Interventions    Lactation Tools Discussed/Used     Consult Status      Hardie PulleyBerkelhammer, Ruth Boschen 01/23/2017, 9:33 PM

## 2017-01-23 NOTE — Plan of Care (Signed)
Problem: Education: Goal: Knowledge of condition will improve Outcome: Completed/Met Date Met: 01/23/17 Patient requested help with breastfeeding, had spanish interpreter present while helping.  Assisted mom in positioning baby in the football hold.  Discussed with mother of baby how to latch baby, and then demonstrated for her after asking permission. Infant nursed off and on both breast, kept loosing latch.  Mom stated that there was pain with the latch.  Attempted to get baby to latch deeper.  At the end of the feeding, the nipple was still round.  Educated mom about breastfeeding before bottle feeding.

## 2017-01-23 NOTE — Progress Notes (Signed)
Post Partum Day 1 Subjective: no complaints, up ad lib, voiding and tolerating PO  Objective: Blood pressure (!) 93/58, pulse 66, temperature 97.9 F (36.6 C), temperature source Oral, resp. rate 18, height 4\' 8"  (1.422 m), weight 147 lb (66.7 kg), last menstrual period 06/16/2016, unknown if currently breastfeeding.  Physical Exam:  General: alert, cooperative and no distress Lochia: appropriate Uterine Fundus: firm Incision: n/a DVT Evaluation: No evidence of DVT seen on physical exam. No cords or calf tenderness.   Recent Labs  01/22/17 0326 01/23/17 0500  HGB 12.8 11.4*  HCT 37.4 33.3*    Assessment/Plan: Plan for discharge tomorrow, Breastfeeding and Contraception Depo   LOS: 1 day   Arlyce Harmanimothy Pascal Stiggers 01/23/2017, 9:11 AM

## 2017-01-24 MED ORDER — IBUPROFEN 600 MG PO TABS: 600.0000 mg | ORAL_TABLET | Freq: Four times a day (QID) | ORAL | 0 refills | Status: DC

## 2017-01-24 NOTE — Discharge Instructions (Signed)
Parto vaginal, cuidados posteriores  (Vaginal Delivery, Care After)  Siga estas instrucciones durante las próximas semanas. Estas indicaciones le proporcionan información acerca de cómo deberá cuidarse después del parto. El médico también podrá darle instrucciones más específicas. El tratamiento ha sido planificado según las prácticas médicas actuales, pero en algunos casos pueden ocurrir problemas. Llame al médico si tiene problemas o preguntas.  QUÉ ESPERAR DESPUÉS DEL PARTO  Después de un parto vaginal, es frecuente tener lo siguiente:  · Hemorragia leve de la vagina.  · Dolor en el abdomen, la vagina y la zona de la piel entre la abertura vaginal y el ano (perineo).  · Calambres pélvicos.  · Fatiga.  INSTRUCCIONES PARA EL CUIDADO EN EL HOGAR  Medicamentos  · Tome los medicamentos de venta libre y los recetados solamente como se lo haya indicado el médico.  · Si le recetaron un antibiótico, tómelo como se lo haya indicado el médico. No interrumpa la administración del antibiótico hasta que lo haya terminado.  Conducir  · No conduzca ni opere maquinaria pesada mientras toma analgésicos recetados.  · No conduzca durante 24 horas si le administraron un sedante.  Estilo de vida  · No beba alcohol. Esto es de suma importancia si está amamantando o toma analgésicos.  · No consuma productos que contengan tabaco, incluidos cigarrillos, tabaco de mascar o cigarrillos electrónicos. Si necesita ayuda para dejar de fumar, consulte al médico.  Comida y bebida  · Beba al menos 8 vasos de 8 onzas (240 cc) de agua todos los días a menos que el médico le indique lo contrario. Si elige amamantar al bebé, quizá deba beber aún más cantidad de agua.  · Ingiera alimentos ricos en fibras todos los días. Estos alimentos pueden ayudarla a prevenir o aliviar el estreñimiento. Los alimentos ricos en fibras incluyen, entre otros:  ? Panes y cereales integrales.  ? Arroz integral.  ? Frijoles.  ? Frutas y verduras  frescas.  Actividad  · Reanude sus actividades normales como se lo haya indicado el médico. Pregúntele al médico qué actividades son seguras para usted.  · Descanse todo lo que pueda. Trate de descansar o tomar una siesta mientras el bebé está durmiendo.  · No levante objetos que pesen más de 10 libras (4,5 kg) hasta que el médico le diga que es seguro hacerlo.  · Hable con el médico sobre cuándo puede volver a tener relaciones sexuales. Esto puede depender de lo siguiente:  ? Riesgo de sufrir infecciones.  ? Velocidad de cicatrización.  ? Comodidad y deseo de tener relaciones sexuales.  Cuidados vaginales  · Si le realizaron una episiotomía o tuvo un desgarro vaginal, contrólese la zona todos los días para detectar signos de infección. Esté atenta a los siguientes signos:  ? Aumento del enrojecimiento, la hinchazón o el dolor.  ? Más líquido o sangre.  ? Calor.  ? Pus o mal olor.  · No use tampones ni se haga duchas vaginales hasta que el médico la autorice.  · Controle la sangre que elimina por la vagina para detectar coágulos. Pueden tener el aspecto de grumos de color rojo oscuro, marrón o negro.  Instrucciones generales  · Mantenga el perineo limpio y seco, como se lo haya indicado el médico.  · Use ropa cómoda y suelta.  · Cuando vaya al baño, siempre higienícese de adelante hacia atrás.  · Pregúntele al médico si puede ducharse o tomar baños de inmersión. Si se le realizó una episiotomía o tuvo un desgarro perineal durante el trabajo del parto o   el parto, es posible que el médico le indique que no tome baños de inmersión durante un determinado tiempo.  · Use un sostén que sujete y ajuste bien sus pechos.  · Si es posible, pídale a alguien que la ayude con las tareas del hogar y a cuidar del bebé durante al menos algunos días después de salir del hospital.  · Concurra a todas las visitas de seguimiento para usted y el bebé, como se lo haya indicado el médico. Esto es importante.  SOLICITE ATENCIÓN MÉDICA  SI:  · Tiene los siguientes síntomas:  ? Secreción vaginal que tiene mal olor.  ? Dificultad para orinar.  ? Dolor al orinar.  ? Aumento o disminución repentinos de la frecuencia con que defeca.  ? Más enrojecimiento, hinchazón o dolor alrededor de la episiotomía o del desgarro vaginal.  ? Más secreción de líquido o sangre de la episiotomía o desgarro vaginal.  ? Pus o mal olor proveniente de la episiotomía o el desgarro vaginal.  ? Fiebre.  ? Erupción cutánea.  ? Poco interés o falta de interés en actividades que solían gustarle.  ? Dudas sobre su cuidado y el del bebé.  · Siente la episiotomía o el desgarro vaginal caliente al tacto.  · La episiotomía o el desgarro vaginal se está abriendo o no parece cicatrizar.  · Siente dolor en las mamas, o están duras o enrojecidas.  · Siente tristeza o preocupación de forma inusual.  · Siente náuseas o vomita.  · Elimina coágulos grandes por la vagina. Si expulsa un coágulo sanguíneo por la vagina, guárdelo para mostrárselo a su médico. No tire la cadena sin que el médico examine el coágulo antes.  · Orina más de lo habitual.  · Se siente mareada o se desmaya.  · No ha amamantado para nada y no ha tenido un período menstrual durante 12 semanas después del parto.  · Dejó de amamantar al bebé y no ha tenido su período menstrual durante 12 semanas después de dejar de amamantar.    SOLICITE ATENCIÓN MÉDICA DE INMEDIATO SI:  · Tiene los siguientes síntomas:  ? Dolor que no desaparece o no mejora con el medicamento.  ? Dolor en el pecho.  ? Dificultad para respirar.  ? Visión borrosa o manchas en la vista.  ? Pensamientos de autolesionarse o lesionar al bebé.  · Comienza a sentir dolor en el abdomen o en una de las piernas.  · Dolor de cabeza intenso.  · Se desmaya.  · Tiene una hemorragia tan intensa de la vagina que empapa dos toallitas sanitarias en una hora.    Esta información no tiene como fin reemplazar el consejo del médico. Asegúrese de hacerle al médico cualquier  pregunta que tenga.  Document Released: 03/21/2005 Document Revised: 07/13/2015 Document Reviewed: 04/05/2015  Elsevier Interactive Patient Education © 2017 Elsevier Inc[employer]

## 2017-01-24 NOTE — Discharge Summary (Signed)
OB Discharge Summary     Patient Name: Linda Payne DOB: September 22, 1985 MRN: [id number]  Date of admission: 01/22/2017 Delivering MD: Catalina Antigua   Date of discharge: 01/24/2017  Admitting diagnosis: 38 wk ctx Intrauterine pregnancy: [redacted]w[redacted]d     Secondary diagnosis:  Principal Problem:   SVD (spontaneous vaginal delivery)  Additional problems: none     Discharge diagnosis: Term Pregnancy Delivered                                                                                                Post partum procedures:none  Augmentation: none  Complications: None  Hospital course:  Onset of Labor With Vaginal Delivery     31 y.o. yo G1P1001 at 108w1d was admitted in Active Labor on 01/22/2017. Patient had an uncomplicated labor course as follows:  Membrane Rupture Time/Date: 1:00 AM ,01/22/2017   Intrapartum Procedures: Episiotomy: None [1]                                         Lacerations:  1st degree [2];Perineal [11]  Patient had a delivery of a Viable infant. 01/22/2017  Information for the patient's newborn:  Lailana, Shira Girl VIVIANA [[id number]]  Delivery Method: Vaginal, Spontaneous Delivery (Filed from Delivery Summary)    Pateint had an uncomplicated postpartum course.  She is ambulating, tolerating a regular diet, passing flatus, and urinating well. Patient is discharged home in stable condition on 01/24/17.   Physical exam  Vitals:   01/22/17 1520 01/22/17 1900 01/23/17 0635 01/24/17 0549  BP:  (!) 103/58 (!) 93/58 120/78  Pulse:  79 66 67  Resp:  20 18 18   Temp: 98.3 F (36.8 C) 98.4 F (36.9 C) 97.9 F (36.6 C)   TempSrc: Oral Oral Oral   Weight:      Height:       General: alert, cooperative and no distress Lochia: appropriate Uterine Fundus: firm Incision: N/A DVT Evaluation: No evidence of DVT seen on physical exam. No significant calf/ankle edema. Labs: Lab Results  Component Value Date   WBC 12.9 (H) 01/23/2017   HGB 11.4  (L) 01/23/2017   HCT 33.3 (L) 01/23/2017   MCV 86.3 01/23/2017   PLT 189 01/23/2017   CMP Latest Ref Rng & Units 10/04/2012  Glucose 70 - 99 mg/dL 95  BUN 6 - 23 mg/dL 4(L)  Creatinine 4.40 - 1.10 mg/dL 1.02(V)  Sodium 253 - 664 mEq/L 138  Potassium 3.5 - 5.1 mEq/L 4.2  Chloride 96 - 112 mEq/L 102  CO2 19 - 32 mEq/L 28  Calcium 8.4 - 10.5 mg/dL 9.3    Discharge instruction: per After Visit Summary and "Baby and Me Booklet".  After visit meds:  Allergies as of 01/24/2017   No Known Allergies     Medication List    TAKE these medications   ibuprofen 600 MG tablet Commonly known as:  ADVIL,MOTRIN Take 1 tablet (600 mg total) by mouth every 6 (six) hours.   prenatal multivitamin Tabs tablet Take 1  tablet by mouth daily at 12 noon.       Diet: routine diet  Activity: Advance as tolerated. Pelvic rest for 6 weeks.   Outpatient follow up: 4 weeks Follow up Appt:No future appointments. Follow up Visit:No Follow-up on file.  Postpartum contraception: Depo Provera  Newborn Data: Live born female  Birth Weight: 6 lb 4.3 oz (2843 g) APGAR: 8, 9  Newborn Delivery   Birth date/time:  01/22/2017 03:52:00 Delivery type:  Vaginal, Spontaneous Delivery      Baby Feeding: Breast Disposition:home with mother   01/24/2017 Frederik PearJulie P Degele, MD

## 2017-03-21 ENCOUNTER — Encounter (HOSPITAL_COMMUNITY): Payer: Self-pay

## 2017-03-22 ENCOUNTER — Encounter (HOSPITAL_COMMUNITY): Payer: Self-pay | Admitting: *Deleted

## 2017-05-02 ENCOUNTER — Ambulatory Visit (HOSPITAL_COMMUNITY)
Admission: RE | Admit: 2017-05-02 | Discharge: 2017-05-02 | Disposition: A | Discharge status: Home / Self Care | Payer: Self-pay | Source: Ambulatory Visit | Attending: Obstetrics and Gynecology | Admitting: Obstetrics and Gynecology

## 2017-05-02 ENCOUNTER — Encounter (HOSPITAL_COMMUNITY): Payer: Self-pay | Admitting: *Deleted

## 2017-05-02 VITALS — BP 102/60

## 2017-05-02 DIAGNOSIS — R87610 Atypical squamous cells of undetermined significance on cytologic smear of cervix (ASC-US): Secondary | ICD-10-CM

## 2017-05-02 DIAGNOSIS — Z1239 Encounter for other screening for malignant neoplasm of breast: Secondary | ICD-10-CM

## 2017-05-02 DIAGNOSIS — R87810 Cervical high risk human papillomavirus (HPV) DNA test positive: Secondary | ICD-10-CM

## 2017-05-02 NOTE — Patient Instructions (Signed)
Explained breast self awareness with Aylani Abarca-Morales. Patient did not need a Pap smear today due to last Pap smear was 12/01/2016. Explained the colposcopy to patient the recommended follow-up for her abnormal Pap smear. [facility]. Appointment scheduled for Tuesday, May 09, 2017 at 1455. Patient aware of appointment and will be there. Let patient know she will need a screening mammogram at age 32 unless clinically indicated prior. Anitra Abarca-Morales verbalized understanding.  Clerance Umland, Kathaleen Maserhristine Poll, RN 9:29 AM

## 2017-05-02 NOTE — Progress Notes (Signed)
[facility] to having an abnormal Pap smear 12/01/2016 that a colposcopy is recommended for follow-up.  Pap Smear: Pap smear not completed today. [facility] with positive HPV. [facility]ps[facility]. Appointment scheduled for Tuesday, May 09, 2017 at 1455. Per patient her most recent Pap smear is the only abnormal Pap smear she has had. Last Pap smear result is in Epic.  Physical exam: Breasts Breasts symmetrical. No skin abnormalities bilateral breasts. No nipple retraction bilateral breasts. Patient is currently breastfeeding. No lymphadenopathy. No lumps palpated bilateral breasts. No complaints of pain or tenderness on exam. Screening mammogram at age 32 unless clinically indicated prior.  Pelvic/Bimanual No Pap smear completed today since last Pap smear was 12/01/2016. Pap smear not indicated per BCCCP guidelines.   Smoking History: Patient has never smoked.  Patient Navigation: Patient education provided. Access to services provided for patient [facility]. Spanish interpreter provided.  Used Spanish interpreter [employer] CorporationErika McReynolds from [employer].

## 2017-05-03 ENCOUNTER — Encounter (HOSPITAL_COMMUNITY): Payer: Self-pay | Admitting: *Deleted

## 2017-05-09 ENCOUNTER — Ambulatory Visit (INDEPENDENT_AMBULATORY_CARE_PROVIDER_SITE_OTHER): Payer: Self-pay | Admitting: Family Medicine

## 2017-05-09 ENCOUNTER — Encounter: Payer: Self-pay | Admitting: Family Medicine

## 2017-05-09 DIAGNOSIS — R87810 Cervical high risk human papillomavirus (HPV) DNA test positive: Secondary | ICD-10-CM

## 2017-05-09 DIAGNOSIS — R87610 Atypical squamous cells of undetermined significance on cytologic smear of cervix (ASC-US): Secondary | ICD-10-CM

## 2017-05-09 LAB — POCT PREGNANCY, URINE: Preg Test, Ur: NEGATIVE

## 2017-05-09 NOTE — Patient Instructions (Signed)
Colpocleisis - Cuidados posteriores (Colpocleisis, Care After) Siga estas instrucciones durante las prximas semanas. Estas indicaciones le proporcionan informacin general acerca de cmo deber cuidarse despus del procedimiento. El mdico tambin podr darle instrucciones ms especficas. El tratamiento se ha planificado de acuerdo a las prcticas mdicas actuales, pero a veces se producen problemas. Comunquese con el mdico si tiene algn problema o tiene dudas despus del procedimiento. QU ESPERAR DESPUS DEL PROCEDIMIENTO Despus del procedimiento, es tpico tener las siguientes sensaciones:  Molestia vaginal que puede aliviarse con analgsicos.  Manchado vaginal. INSTRUCCIONES PARA EL CUIDADO EN EL HOGAR  Siga las instrucciones de su mdico sobre la dieta, el descanso, conducir automviles y actividades generales.  No levante ningn objeto que sea ms pesado que 5 libras (2.3 kg) hasta que el mdico la autorice.  Tome slo medicamentos de venta libre o recetados, segn las indicaciones del mdico.  No tome aspirina. Puede ocasionar hemorragias.  Puede tomar un laxante suave segn las indicaciones del mdico.  Puede tomar baos de asiento de 2 a 3 veces al da para reducir la hinchazn y Environmental health practitioner. ? Llene la baadera hasta la mitad con agua caliente. ? Sintese en el agua y abra un poco el desage. ? Abra el agua caliente para mantener la baadera llena hasta la mitad. Deje el agua corriendo de [place] constante. ? Sumrjase en el agua por 15 a 20 minutos. ? Luego del bao de asiento, seque primero la parte afectada.  Concurra a las consultas de control con su mdico segn las indicaciones.  SOLICITE ATENCIN MDICA SI:  Lance Muss.  Comienza a sangrar por la zona de la herida.  Comienza a Financial risk analyst al orinar u [employer].  Siente dolor abdominal.  Aumenta el dolor en la zona de la herida.  Tiene inflamacin o hinchazn.  Observa secreciones o pus en la  zona afectada.  Aparece una erupcin cutnea.  Cree que los puntos (suturas) en la herida se estn abriendo.  Tiene nuseas, vmitos o diarrea.  Est constipada.  SOLICITE ATENCIN MDICA DE INMEDIATO SI:  Le falta el aire.  Siente dolor en el pecho o en la pierna.  Se desmaya.  Esta informacin no tiene Theme park manager el consejo del mdico. Asegrese de hacerle al mdico cualquier pregunta que tenga. Document Released: 01/09/2013 Document Revised: 01/09/2013 Document Reviewed: 09/12/2012 Elsevier Interactive Patient Education  2017 [employer]. Colposcopia (Colposcopy) La colposcopia es un procedimiento para examinar la parte inferior del tero (cuello del tero), la vagina, la zona que rodea la abertura vaginal (vulva) para identificar anormalidades o signos de enfermedad. Para realizar este procedimiento se utiliza un microscopio con luz o una lupa (colposcopio). Si se encuentran clulas inusuales durante el procedimiento, el mdico puede extraer Lauris Poag de tejido para examinarla (biopsia). Se puede realizar una colposcopia si:  Tiene un Papanicolaou anormal. Un Papanicolaou es una prueba de deteccin que se utiliza para Engineer, manufacturing signos de cncer o infeccin en la vagina, el cuello del tero y Careers information officer.  Tiene un Papanicolaou con resultado positivo de VPH de alto riesgo (virus del papiloma humano).  Si tiene [place] llaga o una lesin en el cuello del tero.  Tiene verrugas genitales en la vulva, la vagina o el cuello del tero.  Tom ciertos medicamentos durante el embarazo, como dietiletilbestrol (DES).  Siente dolor durante las [employer].  Tiene hemorragias vaginales, especialmente despus de Sales promotion account executive.  Tienen que extraerle un plipo del cuello del tero.  Tienen que encontrarle un  hilo perdido del dispositivo intrauterino (DIU). INFORME A SU MDICO:  Cualquier alergia que tenga, incluidas las alergias a medicamentos recetados, al  ltex o al yodo.  Todos los Walt Disneymedicamentos que utiliza, incluidos vitaminas, hierbas, gotas oftlmicas, cremas y [address]icamentos de [address] libre. Lleve una lista de todos sus medicamentos a la Munsonconsulta.  Cualquier problema previo que usted o los miembros de su familia hayan tenido con anestsicos.  Enfermedades de la sangre que tenga.  Cirugas previas.  Cualquier afeccin mdica que tenga, como enfermedad plvica inflamatoria (EPI) o trastorno endometrial.  Algn antecedente de desmayos frecuentes.  Su ciclo menstrual y el mtodo de control de la natalidad (anticonceptivo) que Cobbutiliza.  Su historia clnica, incluido cualquier tratamiento previo en el cuello del tero.  Si est embarazada o podra estarlo. RIESGOS Y COMPLICACIONES En general, se trata de un procedimiento seguro. Sin embargo, pueden ocurrir complicaciones, por ejemplo:  Engineer, miningDolor.  Infeccin, que puede incluir fiebre, secrecin con mal olor o dolor plvico.  Hemorragia o secrecin.  Diagnstico errneo.  Desmayos y Architectural technologistreacciones vasovagales, pero esto es poco frecuente.  Reacciones alrgicas a los medicamentos.  Daos a Systems developerotras estructuras u otros rganos. ANTES DEL PROCEDIMIENTO  Si tiene el perodo menstrual o lo tendr en el momento del procedimiento, dgaselo al mdico. En general, la colposcopia no se realiza Environmental consultantdurante la menstruacin.  Contine con sus prcticas anticonceptivas antes y despus del procedimiento.  Durante las 24horas previas a la colposcopia: ? No se haga duchas vaginales. ? No use tampones. ? No se aplique medicamentos, cremas o supositorios en la vagina. ? No tenga relaciones sexuales.  Consulte al mdico si debe hacer o no lo siguiente: ? Cambiar o suspender los medicamentos que toma habitualmente. Esto es muy importante si toma medicamentos para la diabetes o anticoagulantes. ? Tomar medicamentos como aspirina e ibuprofeno. Estos medicamentos pueden tener un efecto anticoagulante en la  [place]angre. No tome estos medicamentos antes del procedimiento si el mdico le indica que no lo haga. Es probable que Public affairs consultantel mdico le diga que evite tomar aspirina o medicamentos que contienen aspirina durante 7das antes del procedimiento.  Siga las indicaciones del mdico respecto de las restricciones para las comidas o las bebidas. Probablemente deba seguir una dieta normal el da del procedimiento y no omitir Patent examinerninguna comida.  Pueden hacerle un examen o estudios. Se realizar una prueba de Charter Communicationsembarazo el da del procedimiento.  Pueden extraerle Lauris Poaguna muestra de Unionsangre o pedirle [place]una muestra de [place]orina.  Haga planes para que una persona la lleve a su casa despus del hospital o la clnica.  Si se ir a su casa inmediatamente despus del procedimiento, planifique que alguien se quede con usted durante 24horas. PROCEDIMIENTO  Estar acostada boca arriba, con los pies en los soportes (estribos).  Se introducir en la vagina un instrumento tibio y lubricado (espculo). El Reliant Energyespculo se usar para separar las paredes de la vagina de modo que el mdico pueda ver el cuello del tero y el interior de la vagina.  Se utilizar un hisopo para Scientific laboratory techniciancolocar una pequea cantidad de solucin lquida en las zonas que se examinarn. Esta solucin facilita la observacin de clulas anormales. Puede sentir un leve ardor en este momento.  Se usar el colposcopio para observar el cuello del tero con una luz blanca brillante. Se sostendr el colposcopio cerca de la vulva, y Electronics engineereste amplificar la vulva, la vagina y el cuello del tero para facilitar la observacin.  El mdico puede decidir si realiza una biopsia. En este caso: ?  Pueden administrarle un medicamento para adormecer la zona (anestesia local). ? Se emplearn instrumentos quirrgicos para succionar mucosidad y clulas a travs de la vagina. ? Puede sentir un dolor leve cuando le extraen la [place] de tejido. ? Puede haber hemorragia. Se puede usar una solucin para  Comptroller. ? Si se necesita Lauris Poag de tejido del interior del cuello del tero, se puede realizar otro procedimiento, llamado curetaje endocervical (CEC). Durante este procedimiento, se usar un instrumento curvo (cureta) para raspar clulas del cuello del tero o de la parte superior del tero (endocervix).  El Engineer, civil (consulting) la ubicacin de cualquier anormalidad. Este procedimiento puede variar segn el mdico y el hospital. DESPUS DEL PROCEDIMIENTO  Estar recostada y har reposo durante unos minutos. Tal vez le ofrezcan jugo o galletas.  Le controlarn la presin arterial, la frecuencia cardaca, la frecuencia respiratoria y Air cabin crew de oxgeno en la sangre hasta que haya desaparecido el efecto de los medicamentos administrados.  Quizs [employer] de compresin. Estas medias ayudan a Transport planner formacin de cogulos sanguneos y a Building services engineer de las piernas.  Puede tener calambres en el abdomen. Esto debe desaparecer despus de algunos minutos. Esta informacin no tiene Theme park manager el consejo del mdico. Asegrese de hacerle al mdico cualquier pregunta que tenga. Document Released: 03/21/2005 Document Revised: 11/21/2012 Document Reviewed: 10/26/2015 Elsevier Interactive Patient Education  [employer].

## 2017-05-09 NOTE — Progress Notes (Signed)
    GYNECOLOGY CLINIC COLPOSCOPY PROCEDURE NOTE  32 y.o. G1P1001 here for colposcopy for ASCUS with POSITIVE high risk HPV pap smear. 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  Patient given informed consent, signed copy in the chart, time out was performed.  Placed in lithotomy position. Cervix viewed with speculum and colposcope after application of acetic acid.   Colposcopy adequate? Yes  acetowhite lesion(s) noted at 12 and 6  o'clock; corresponding biopsies obtained.  ECC specimen obtained. All specimens were labeled and sent to pathology.   Patient was given post procedure instructions.  Will follow up pathology and manage accordingly; patient will be contacted with results and recommendations.  Routine preventative health maintenance measures emphasized.  Rolm BookbinderAmber Kolten Ryback, DO

## 2017-05-10 ENCOUNTER — Other Ambulatory Visit (HOSPITAL_COMMUNITY)
Admission: RE | Admit: 2017-05-10 | Discharge: 2017-05-10 | Disposition: A | Discharge status: Home / Self Care | Payer: Self-pay | Source: Ambulatory Visit | Attending: Family Medicine | Admitting: Family Medicine

## 2017-05-10 ENCOUNTER — Other Ambulatory Visit: Payer: Self-pay | Admitting: *Deleted

## 2017-05-10 DIAGNOSIS — R87610 Atypical squamous cells of undetermined significance on cytologic smear of cervix (ASC-US): Secondary | ICD-10-CM

## 2017-05-10 DIAGNOSIS — R87810 Cervical high risk human papillomavirus (HPV) DNA test positive: Principal | ICD-10-CM

## 2017-05-16 ENCOUNTER — Encounter: Payer: Self-pay | Admitting: *Deleted

## 2017-05-23 ENCOUNTER — Telehealth: Payer: Self-pay | Admitting: General Practice

## 2017-05-23 NOTE — Telephone Encounter (Signed)
-----   Message from Marti SleighSctaria J Malloy, VermontNT sent at 05/15/2017  4:32 PM EST -----   ----- Message ----- From: Rolm BookbinderMoss, Amber, DO Sent: 05/12/2017   9:44 AM To: [facility] Admin Pool  Please call patient. Her colposcopy results were normal except for positive HPV. She needs a repeat pap smear in 1 year. She is spanish speaking.

## 2017-05-23 NOTE — Telephone Encounter (Signed)
Called patient with Linda Payne for interpreter & informed patient of results and recommended follow up in 1 year. Patient verbalized understanding & had no questions

## 2018-02-10 ENCOUNTER — Emergency Department (HOSPITAL_COMMUNITY)
Admission: EM | Admit: 2018-02-10 | Discharge: 2018-02-11 | Disposition: A | Discharge status: Home / Self Care | Payer: Self-pay | Attending: Emergency Medicine | Admitting: Emergency Medicine

## 2018-02-10 ENCOUNTER — Other Ambulatory Visit: Payer: Self-pay

## 2018-02-10 ENCOUNTER — Encounter (HOSPITAL_COMMUNITY): Payer: Self-pay

## 2018-02-10 ENCOUNTER — Emergency Department (HOSPITAL_COMMUNITY): Payer: Self-pay

## 2018-02-10 DIAGNOSIS — J189 Pneumonia, unspecified organism: Secondary | ICD-10-CM | POA: Insufficient documentation

## 2018-02-10 DIAGNOSIS — R509 Fever, unspecified: Secondary | ICD-10-CM

## 2018-02-10 DIAGNOSIS — R079 Chest pain, unspecified: Secondary | ICD-10-CM | POA: Insufficient documentation

## 2018-02-10 LAB — CBC WITH DIFFERENTIAL/PLATELET
ABS IMMATURE GRANULOCYTES: 0.2 10*3/uL — AB (ref 0.00–0.07)
Basophils Absolute: 0.1 10*3/uL (ref 0.0–0.1)
Basophils Relative: 0 %
EOS PCT: 0 %
Eosinophils Absolute: 0.1 10*3/uL (ref 0.0–0.5)
HEMATOCRIT: 36.6 % (ref 36.0–46.0)
HEMOGLOBIN: 12.1 g/dL (ref 12.0–15.0)
Immature Granulocytes: 1 %
LYMPHS PCT: 11 %
Lymphs Abs: 2.2 10*3/uL (ref 0.7–4.0)
MCH: 28 pg (ref 26.0–34.0)
MCHC: 33.1 g/dL (ref 30.0–36.0)
MCV: 84.7 fL (ref 80.0–100.0)
MONOS PCT: 7 %
Monocytes Absolute: 1.4 10*3/uL — ABNORMAL HIGH (ref 0.1–1.0)
NEUTROS ABS: 15.9 10*3/uL — AB (ref 1.7–7.7)
Neutrophils Relative %: 81 %
Platelets: 397 10*3/uL (ref 150–400)
RBC: 4.32 MIL/uL (ref 3.87–5.11)
RDW: 14 % (ref 11.5–15.5)
WBC: 19.9 10*3/uL — ABNORMAL HIGH (ref 4.0–10.5)
nRBC: 0 % (ref 0.0–0.2)

## 2018-02-10 LAB — I-STAT BETA HCG BLOOD, ED (MC, WL, AP ONLY): I-stat hCG, quantitative: 23 m[IU]/mL — ABNORMAL HIGH (ref <5)

## 2018-02-10 LAB — I-STAT CG4 LACTIC ACID, ED: Lactic Acid, Venous: 1.63 mmol/L (ref 0.5–1.9)

## 2018-02-10 MED ORDER — ACETAMINOPHEN 500 MG PO TABS
1000.0000 mg | ORAL_TABLET | Freq: Once | ORAL | Status: AC
  Administered 2018-02-11: 1000 mg via ORAL
  Filled 2018-02-10: qty 2

## 2018-02-10 MED ORDER — SODIUM CHLORIDE 0.9 % IV SOLN
1.0000 g | Freq: Once | INTRAVENOUS | Status: AC
  Administered 2018-02-11: 1 g via INTRAVENOUS
  Filled 2018-02-10: qty 10

## 2018-02-10 MED ORDER — LACTATED RINGERS IV BOLUS
1000.0000 mL | Freq: Once | INTRAVENOUS | Status: AC
  Administered 2018-02-11: 1000 mL via INTRAVENOUS

## 2018-02-10 NOTE — ED Provider Notes (Signed)
[facility] EMERGENCY DEPARTMENT Provider Note   CSN: [id number] Arrival date & time: 02/10/18  2306     History   Chief Complaint Chief Complaint  Patient presents with  . Cough  . Chest Pain    HPI Linda Payne is a 32 y.o. female.  32yo F who p/w cough. Interpreter used however patient is poor historian and history was difficult. She reports to me that she began having a cough ~1 week ago. She reported to nurse via interpreter that she had a cough 2 weeks ago that got better then got worse again. Recently, she started having right "rib" pain with the cough. The pain is worse with taking a deep breath and walking around. When asked about the quality of the cough, she states, "I felt like a cold wind had entered my body and I started coughing." She reports fevers over past 2 days. She was seen at [facility] clinic yesterday, examined and given azithromycin, cough syrup, loratadine, and naproxen for "a bacterial infection in the lungs." She took 1 pill of the azithromycin but it made her feel sick and vomit so she didn't take anymore. No medications this evening PTA. She denies any shortness of breath. No diarrhea. She has had some associated sore throat and nasal congestion. No sick contacts or recent travel.   The history is provided by the patient. The history is limited by a language barrier. A language interpreter was used.  Cough  Associated symptoms include chest pain.  Chest Pain   Associated symptoms include cough.    History reviewed. No pertinent past medical history.  Patient Active Problem List   Diagnosis Date Noted  . SVD (spontaneous vaginal delivery) 01/22/2017  . ASCUS with positive high risk HPV cervical 01/22/2017  . Back pain 10/25/2012    History reviewed. No pertinent surgical history.   OB History    Gravida  1   Para  1   Term  1   Preterm      AB      Living  1     SAB      TAB      Ectopic      Multiple    0   Live Births  1            Home Medications    Prior to Admission medications   Medication Sig Start Date End Date Taking? Authorizing Provider  amoxicillin-clavulanate (AUGMENTIN) 875-125 MG tablet Take 1 tablet by mouth every 12 (twelve) hours. 02/11/18   Shamari Trostel, Ambrose Finland, MD  doxycycline (VIBRAMYCIN) 100 MG capsule Take 1 capsule (100 mg total) by mouth 2 (two) times daily for 7 days. 02/11/18 02/18/18  Bradie Sangiovanni, Ambrose Finland, MD  ibuprofen (ADVIL,MOTRIN) 600 MG tablet Take 1 tablet (600 mg total) by mouth every 6 (six) hours. Patient not taking: Reported on 02/11/2018 01/24/17   Degele, Kandra Nicolas, MD    Family History Family History  Problem Relation Age of Onset  . Hypertension Mother   . Diabetes Mother     Social History Social History   Tobacco Use  . Smoking status: Never Smoker  . Smokeless tobacco: Never Used  Substance Use Topics  . Alcohol use: No  . Drug use: No     Allergies   Patient has no known allergies.   Review of Systems Review of Systems  Respiratory: Positive for cough.   Cardiovascular: Positive for chest pain.     Physical Exam Updated Vital  Signs BP (!) 96/59   Pulse 98   Temp 98.6 F (37 C)   Resp (!) 21   Ht 4' 5.15" (1.35 m)   Wt 65.8 kg   SpO2 96%   BMI 36.09 kg/m   Physical Exam  Constitutional: She is oriented to person, place, and time. She appears well-developed and well-nourished. No distress.  HENT:  Head: Normocephalic and atraumatic.  Moist mucous membranes  Eyes: Conjunctivae are normal.  Neck: Neck supple.  Cardiovascular: Regular rhythm and normal heart sounds. Tachycardia present.  No murmur heard. Pulmonary/Chest: Effort normal. No accessory muscle usage. No respiratory distress.  Diminished b/l, poor inspiratory effort  Abdominal: Soft. Bowel sounds are normal. She exhibits no distension. There is no tenderness.  Musculoskeletal: She exhibits no edema.  Neurological: She is alert and  oriented to person, place, and time.  Fluent speech  Skin: Skin is warm. She is diaphoretic.  Psychiatric: She has a normal mood and affect. Judgment normal.  Nursing note and vitals reviewed.    ED Treatments / Results  Labs (all labs ordered are listed, but only abnormal results are displayed) Labs Reviewed  COMPREHENSIVE METABOLIC PANEL - Abnormal; Notable for the following components:      Result Value   Sodium 133 (*)    Potassium 2.8 (*)    Chloride 96 (*)    Glucose, Bld 158 (*)    Calcium 8.8 (*)    Total Protein 8.5 (*)    All other components within normal limits  CBC WITH DIFFERENTIAL/PLATELET - Abnormal; Notable for the following components:   WBC 19.9 (*)    Neutro Abs 15.9 (*)    Monocytes Absolute 1.4 (*)    Abs Immature Granulocytes 0.20 (*)    All other components within normal limits  HCG, QUANTITATIVE, PREGNANCY - Abnormal; Notable for the following components:   hCG, Beta Chain, Quant, S 9 (*)    All other components within normal limits  D-DIMER, QUANTITATIVE (NOT AT [facility]) - Abnormal; Notable for the following components:   D-Dimer, Quant 3.32 (*)    All other components within normal limits  I-STAT BETA HCG BLOOD, ED ([facility], [facility], [facility] ONLY) - Abnormal; Notable for the following components:   I-stat hCG, quantitative 23.0 (*)    All other components within normal limits  CULTURE, BLOOD (ROUTINE X 2)  CULTURE, BLOOD (ROUTINE X 2)  PROTIME-INR  I-STAT CG4 LACTIC ACID, ED    EKG EKG Interpretation  Date/Time:  Saturday February 10 2018 23:20:10 EST Ventricular Rate:  133 PR Interval:    QRS Duration: 80 QT Interval:  339 QTC Calculation: 505 R Axis:   103 Text Interpretation:  Sinus tachycardia Multiple ventricular premature complexes Aberrant complex Borderline right axis deviation Borderline repolarization abnormality Prolonged QT interval No previous ECGs available Confirmed by Frederick Peers [phone]) on 02/10/2018 11:26:57 PM   Radiology Dg Chest  2 View  Result Date: 02/11/2018 CLINICAL DATA:  Shortness of breath for 1 week. EXAM: CHEST - 2 VIEW COMPARISON:  None. FINDINGS: The cardiac silhouette, mediastinal and hilar contours are within normal limits. There is mild elevation of the right hemidiaphragm with overlying atelectasis. There is also streaky left basilar atelectasis. No definite infiltrates or effusions. No worrisome pulmonary lesions. The bony thorax is intact. IMPRESSION: 1. Bibasilar atelectasis. 2. Lungs otherwise clear. Electronically Signed   By: Rudie Meyer M.D.   On: 02/11/2018 00:01    Procedures Procedures (including critical care time)  Medications Ordered in ED Medications  cefTRIAXone (ROCEPHIN) 1 g in sodium chloride 0.9 % 100 mL IVPB (0 g Intravenous Stopped 02/11/18 0106)  lactated ringers bolus 1,000 mL (0 mLs Intravenous Stopped 02/11/18 0243)  acetaminophen (TYLENOL) tablet 1,000 mg (1,000 mg Oral Given 02/11/18 0018)     Initial Impression / Assessment and Plan / ED Course  I have reviewed the triage vital signs and the nursing notes.  Pertinent labs & imaging results that were available during my care of the patient were reviewed by me and considered in my medical decision making (see chart for details).    She was alert, non-toxic, NAD on exam. T 102.3, HR 130s.  Obtain blood and urine cultures.  Lactate was normal.  Labs show potassium 2.8 for which I gave her potassium repletion, WBC 19.9.  Chest x-ray clear, I gave ceftriaxone to cover for PNA as I suspect bacterial process given sx and prolonged nature of cough.   hCG was 9. I had extensive discussion on patient regarding the fact that this may not represent true pregnancy but we would need to obtain repeat value to follow over time.  She understands need for outpatient follow-up for this.  D-dimer was sent which was positive.  I also had a long discussion regarding work-up options and risks and benefits of each option.  Ultimately, patient  declined CT scan and prefers to start antibiotics and follow-up for reassessment.  She understands risks involved in not obtaining CT scan including undiagnosed PE.  I feel that her overall risk of PE is extremely unlikely given clinical picture of infectious process.  She is well-appearing on reassessment, improvement in vital signs.  Provided with Augmentin to add to azithromycin which she already has at home.  She will follow-up with clinic this week.  Extensively reviewed return precautions and she voiced understanding.  Final Clinical Impressions(s) / ED Diagnoses   Final diagnoses:  Community acquired pneumonia, unspecified laterality  Chest pain, unspecified type  Fever, unspecified fever cause    ED Discharge Orders         Ordered    doxycycline (VIBRAMYCIN) 100 MG capsule  2 times daily     02/11/18 0240    amoxicillin-clavulanate (AUGMENTIN) 875-125 MG tablet  Every 12 hours     02/11/18 0246           Taijah Macrae, Ambrose Finland, MD 02/11/18 [phone]

## 2018-02-11 LAB — COMPREHENSIVE METABOLIC PANEL
ALBUMIN: 3.5 g/dL (ref 3.5–5.0)
ALK PHOS: 64 U/L (ref 38–126)
ALT: 23 U/L (ref 0–44)
AST: 20 U/L (ref 15–41)
Anion gap: 12 (ref 5–15)
BILIRUBIN TOTAL: 0.5 mg/dL (ref 0.3–1.2)
BUN: 10 mg/dL (ref 6–20)
CO2: 25 mmol/L (ref 22–32)
CREATININE: 0.73 mg/dL (ref 0.44–1.00)
Calcium: 8.8 mg/dL — ABNORMAL LOW (ref 8.9–10.3)
Chloride: 96 mmol/L — ABNORMAL LOW (ref 98–111)
GFR calc Af Amer: >60 mL/min (ref >60)
GFR calc non Af Amer: >60 mL/min (ref >60)
GLUCOSE: 158 mg/dL — AB (ref 70–99)
POTASSIUM: 2.8 mmol/L — AB (ref 3.5–5.1)
Sodium: 133 mmol/L — ABNORMAL LOW (ref 135–145)
TOTAL PROTEIN: 8.5 g/dL — AB (ref 6.5–8.1)

## 2018-02-11 LAB — PROTIME-INR
INR: 1.18
PROTHROMBIN TIME: 14.9 s (ref 11.4–15.2)

## 2018-02-11 LAB — D-DIMER, QUANTITATIVE (NOT AT ARMC): D DIMER QUANT: 3.32 ug{FEU}/mL — AB (ref 0.00–0.50)

## 2018-02-11 LAB — HCG, QUANTITATIVE, PREGNANCY: hCG, Beta Chain, Quant, S: 9 m[IU]/mL — ABNORMAL HIGH (ref <5)

## 2018-02-11 MED ORDER — AMOXICILLIN-POT CLAVULANATE 875-125 MG PO TABS: 1.0000 | ORAL_TABLET | Freq: Two times a day (BID) | ORAL | 0 refills | Status: DC

## 2018-02-11 MED ORDER — DOXYCYCLINE HYCLATE 100 MG PO CAPS: 100.0000 mg | ORAL_CAPSULE | Freq: Two times a day (BID) | ORAL | 0 refills | Status: AC

## 2018-02-11 NOTE — Discharge Instructions (Addendum)
IT IS VERY IMPORTANT FOR YOU TO GO TO CLINIC IN 2-3 DAYS FOR RECHECK. GO TO [facility]OMEN[facility]S CLINIC TO HAVE REPEAT BLOOD WORK (PREGNANCY HORMONE LEVEL IN THE BLOOD). RETURN TO ER IF YOU HAVE BREATHING PROBLEMS, WORSENING COUGH, WORSENING CHEST PAIN, OR WORSENING FEVERS.

## 2018-02-16 LAB — CULTURE, BLOOD (ROUTINE X 2)
CULTURE: NO GROWTH
Special Requests: ADEQUATE

## 2018-05-28 ENCOUNTER — Ambulatory Visit: Payer: Self-pay

## 2019-03-12 ENCOUNTER — Encounter (HOSPITAL_COMMUNITY)
Admission: EM | Disposition: A | Discharge status: Home / Self Care | Payer: Self-pay | Source: Home / Self Care | Attending: Emergency Medicine

## 2019-03-12 ENCOUNTER — Emergency Department (HOSPITAL_COMMUNITY): Payer: Worker's Compensation | Admitting: Certified Registered"

## 2019-03-12 ENCOUNTER — Encounter (HOSPITAL_COMMUNITY): Payer: Self-pay | Admitting: Emergency Medicine

## 2019-03-12 ENCOUNTER — Other Ambulatory Visit: Payer: Self-pay

## 2019-03-12 ENCOUNTER — Ambulatory Visit (HOSPITAL_COMMUNITY)
Admission: EM | Admit: 2019-03-12 | Discharge: 2019-03-12 | Disposition: A | Discharge status: Home / Self Care | Payer: Self-pay | Attending: Emergency Medicine | Admitting: Emergency Medicine

## 2019-03-12 DIAGNOSIS — Y990 Civilian activity done for income or pay: Secondary | ICD-10-CM | POA: Insufficient documentation

## 2019-03-12 DIAGNOSIS — W260XXA Contact with knife, initial encounter: Secondary | ICD-10-CM | POA: Insufficient documentation

## 2019-03-12 DIAGNOSIS — Z20828 Contact with and (suspected) exposure to other viral communicable diseases: Secondary | ICD-10-CM | POA: Insufficient documentation

## 2019-03-12 DIAGNOSIS — S6982XA Other specified injuries of left wrist, hand and finger(s), initial encounter: Secondary | ICD-10-CM | POA: Insufficient documentation

## 2019-03-12 DIAGNOSIS — S61209A Unspecified open wound of unspecified finger without damage to nail, initial encounter: Secondary | ICD-10-CM

## 2019-03-12 DIAGNOSIS — S66222A Laceration of extensor muscle, fascia and tendon of left thumb at wrist and hand level, initial encounter: Secondary | ICD-10-CM | POA: Insufficient documentation

## 2019-03-12 DIAGNOSIS — S61012A Laceration without foreign body of left thumb without damage to nail, initial encounter: Secondary | ICD-10-CM | POA: Insufficient documentation

## 2019-03-12 HISTORY — PX: I & D EXTREMITY: SHX5045

## 2019-03-12 HISTORY — PX: CLOSED REDUCTION FINGER WITH PERCUTANEOUS PINNING: SHX5612

## 2019-03-12 LAB — RESPIRATORY PANEL BY RT PCR (FLU A&B, COVID)
Influenza A by PCR: NEGATIVE
Influenza B by PCR: NEGATIVE
SARS Coronavirus 2 by RT PCR: NEGATIVE

## 2019-03-12 LAB — POCT PREGNANCY, URINE: Preg Test, Ur: NEGATIVE

## 2019-03-12 SURGERY — IRRIGATION AND DEBRIDEMENT EXTREMITY
Anesthesia: General | Site: Thumb | Laterality: Left

## 2019-03-12 MED ORDER — ONDANSETRON HCL 4 MG/2ML IJ SOLN
INTRAMUSCULAR | Status: DC | PRN
  Administered 2019-03-12: 4 mg via INTRAVENOUS

## 2019-03-12 MED ORDER — HYDROCODONE-ACETAMINOPHEN 5-325 MG PO TABS: 1.0000 | ORAL_TABLET | ORAL | 0 refills | Status: AC | PRN

## 2019-03-12 MED ORDER — SUCCINYLCHOLINE CHLORIDE 200 MG/10ML IV SOSY
PREFILLED_SYRINGE | INTRAVENOUS | Status: DC | PRN
  Administered 2019-03-12: 90 mg via INTRAVENOUS

## 2019-03-12 MED ORDER — PROPOFOL 10 MG/ML IV BOLUS
INTRAVENOUS | Status: AC
  Filled 2019-03-12: qty 20

## 2019-03-12 MED ORDER — FENTANYL CITRATE (PF) 100 MCG/2ML IJ SOLN
INTRAMUSCULAR | Status: DC | PRN
  Administered 2019-03-12: 100 ug via INTRAVENOUS

## 2019-03-12 MED ORDER — MIDAZOLAM HCL 2 MG/2ML IJ SOLN
INTRAMUSCULAR | Status: AC
  Filled 2019-03-12: qty 2

## 2019-03-12 MED ORDER — DEXAMETHASONE SODIUM PHOSPHATE 10 MG/ML IJ SOLN
INTRAMUSCULAR | Status: AC
  Filled 2019-03-12: qty 4

## 2019-03-12 MED ORDER — CHLORHEXIDINE GLUCONATE 4 % EX LIQD
60.0000 mL | Freq: Once | CUTANEOUS | Status: DC
  Filled 2019-03-12: qty 60

## 2019-03-12 MED ORDER — PROPOFOL 1000 MG/100ML IV EMUL
INTRAVENOUS | Status: AC
  Filled 2019-03-12: qty 100

## 2019-03-12 MED ORDER — PHENYLEPHRINE 40 MCG/ML (10ML) SYRINGE FOR IV PUSH (FOR BLOOD PRESSURE SUPPORT)
PREFILLED_SYRINGE | INTRAVENOUS | Status: AC
  Filled 2019-03-12: qty 30

## 2019-03-12 MED ORDER — 0.9 % SODIUM CHLORIDE (POUR BTL) OPTIME
TOPICAL | Status: DC | PRN
  Administered 2019-03-12: 1000 mL

## 2019-03-12 MED ORDER — POVIDONE-IODINE 10 % EX SWAB: 2.0000 "application " | Freq: Once | CUTANEOUS | Status: DC

## 2019-03-12 MED ORDER — PROPOFOL 10 MG/ML IV BOLUS
INTRAVENOUS | Status: DC | PRN
  Administered 2019-03-12: 120 mg via INTRAVENOUS

## 2019-03-12 MED ORDER — FENTANYL CITRATE (PF) 250 MCG/5ML IJ SOLN
INTRAMUSCULAR | Status: AC
  Filled 2019-03-12: qty 5

## 2019-03-12 MED ORDER — DEXAMETHASONE SODIUM PHOSPHATE 10 MG/ML IJ SOLN
INTRAMUSCULAR | Status: DC | PRN
  Administered 2019-03-12: 4 mg via INTRAVENOUS

## 2019-03-12 MED ORDER — LIDOCAINE 2% (20 MG/ML) 5 ML SYRINGE
INTRAMUSCULAR | Status: DC | PRN
  Administered 2019-03-12: 60 mg via INTRAVENOUS

## 2019-03-12 MED ORDER — ONDANSETRON HCL 4 MG/2ML IJ SOLN
INTRAMUSCULAR | Status: AC
  Filled 2019-03-12: qty 4

## 2019-03-12 MED ORDER — CEFAZOLIN SODIUM-DEXTROSE 2-4 GM/100ML-% IV SOLN
2.0000 g | INTRAVENOUS | Status: AC
  Administered 2019-03-12: 2 g via INTRAVENOUS
  Filled 2019-03-12: qty 100

## 2019-03-12 MED ORDER — HYDROCODONE-ACETAMINOPHEN 5-325 MG PO TABS
1.0000 | ORAL_TABLET | ORAL | Status: AC | PRN
  Administered 2019-03-12: 1 via ORAL

## 2019-03-12 MED ORDER — LIDOCAINE 2% (20 MG/ML) 5 ML SYRINGE
INTRAMUSCULAR | Status: AC
  Filled 2019-03-12: qty 10

## 2019-03-12 MED ORDER — MIDAZOLAM HCL 5 MG/5ML IJ SOLN
INTRAMUSCULAR | Status: DC | PRN
  Administered 2019-03-12: 2 mg via INTRAVENOUS

## 2019-03-12 MED ORDER — CEPHALEXIN 500 MG PO CAPS: 500.0000 mg | ORAL_CAPSULE | Freq: Four times a day (QID) | ORAL | 0 refills | Status: AC

## 2019-03-12 MED ORDER — SODIUM CHLORIDE 0.9 % IR SOLN
Status: DC | PRN
  Administered 2019-03-12: 3000 mL

## 2019-03-12 MED ORDER — ROCURONIUM BROMIDE 10 MG/ML (PF) SYRINGE
PREFILLED_SYRINGE | INTRAVENOUS | Status: AC
  Filled 2019-03-12: qty 10

## 2019-03-12 MED ORDER — HYDROCODONE-ACETAMINOPHEN 5-325 MG PO TABS
ORAL_TABLET | ORAL | Status: AC
  Filled 2019-03-12: qty 1

## 2019-03-12 MED ORDER — LIDOCAINE HCL (PF) 1 % IJ SOLN
5.0000 mL | Freq: Once | INTRAMUSCULAR | Status: AC
  Administered 2019-03-12: 5 mL via INTRADERMAL
  Filled 2019-03-12: qty 5

## 2019-03-12 MED ORDER — CEFAZOLIN SODIUM-DEXTROSE 2-4 GM/100ML-% IV SOLN
INTRAVENOUS | Status: AC
  Filled 2019-03-12: qty 100

## 2019-03-12 MED ORDER — SUCCINYLCHOLINE CHLORIDE 200 MG/10ML IV SOSY
PREFILLED_SYRINGE | INTRAVENOUS | Status: AC
  Filled 2019-03-12: qty 10

## 2019-03-12 MED ORDER — LACTATED RINGERS IV SOLN
INTRAVENOUS | Status: DC
  Administered 2019-03-12: 18:00:00 via INTRAVENOUS

## 2019-03-12 SURGICAL SUPPLY — 40 items
BNDG CONFORM 2 STRL LF (GAUZE/BANDAGES/DRESSINGS) ×4 IMPLANT
BNDG ELASTIC 3X5.8 VLCR STR LF (GAUZE/BANDAGES/DRESSINGS) ×4 IMPLANT
BNDG ELASTIC 4X5.8 VLCR STR LF (GAUZE/BANDAGES/DRESSINGS) ×4 IMPLANT
BNDG ESMARK 4X9 LF (GAUZE/BANDAGES/DRESSINGS) ×4 IMPLANT
BNDG GAUZE ELAST 4 BULKY (GAUZE/BANDAGES/DRESSINGS) ×4 IMPLANT
CORD BIPOLAR FORCEPS 12FT (ELECTRODE) ×4 IMPLANT
COVER SURGICAL LIGHT HANDLE (MISCELLANEOUS) ×4 IMPLANT
CUFF TOURN SGL QUICK 18X4 (TOURNIQUET CUFF) ×4 IMPLANT
DRAPE OEC MINIVIEW 54X84 (DRAPES) ×4 IMPLANT
DRAPE SURG 17X23 STRL (DRAPES) ×4 IMPLANT
DRSG EMULSION OIL 3X3 NADH (GAUZE/BANDAGES/DRESSINGS) ×4 IMPLANT
GAUZE SPONGE 4X4 12PLY STRL (GAUZE/BANDAGES/DRESSINGS) ×4 IMPLANT
GAUZE XEROFORM 1X8 LF (GAUZE/BANDAGES/DRESSINGS) ×4 IMPLANT
GLOVE BIOGEL M 8.0 STRL (GLOVE) ×4 IMPLANT
GLOVE SS BIOGEL STRL SZ 8 (GLOVE) ×2 IMPLANT
GLOVE SUPERSENSE BIOGEL SZ 8 (GLOVE) ×2
GOWN STRL REUS W/ TWL LRG LVL3 (GOWN DISPOSABLE) ×2 IMPLANT
GOWN STRL REUS W/ TWL XL LVL3 (GOWN DISPOSABLE) ×4 IMPLANT
GOWN STRL REUS W/TWL LRG LVL3 (GOWN DISPOSABLE) ×2
GOWN STRL REUS W/TWL XL LVL3 (GOWN DISPOSABLE) ×4
K-WIRE DBL TROCAR .045X4 (WIRE) ×4
K-wire 1.1mm (0.045") x 102mm (4") ×4 IMPLANT
KIT BASIN OR (CUSTOM PROCEDURE TRAY) ×4 IMPLANT
KIT TURNOVER KIT B (KITS) ×4 IMPLANT
KWIRE DBL TROCAR .045X4 (WIRE) ×2 IMPLANT
MANIFOLD NEPTUNE II (INSTRUMENTS) ×4 IMPLANT
NS IRRIG 1000ML POUR BTL (IV SOLUTION) ×4 IMPLANT
PACK ORTHO EXTREMITY (CUSTOM PROCEDURE TRAY) ×4 IMPLANT
PAD ARMBOARD 7.5X6 YLW CONV (MISCELLANEOUS) ×4 IMPLANT
PAD CAST 3X4 CTTN HI CHSV (CAST SUPPLIES) ×2 IMPLANT
PADDING CAST COTTON 3X4 STRL (CAST SUPPLIES) ×2
SET CYSTO W/LG BORE CLAMP LF (SET/KITS/TRAYS/PACK) ×4 IMPLANT
SOL PREP POV-IOD 4OZ 10% (MISCELLANEOUS) ×8 IMPLANT
SPLINT FIBERGLASS 4X30 (CAST SUPPLIES) ×4 IMPLANT
SPONGE LAP 4X18 RFD (DISPOSABLE) ×4 IMPLANT
TOWEL GREEN STERILE (TOWEL DISPOSABLE) ×4 IMPLANT
TOWEL GREEN STERILE FF (TOWEL DISPOSABLE) ×4 IMPLANT
TUBE CONNECTING 12'X1/4 (SUCTIONS) ×1
TUBE CONNECTING 12X1/4 (SUCTIONS) ×3 IMPLANT
YANKAUER SUCT BULB TIP NO VENT (SUCTIONS) ×4 IMPLANT

## 2019-03-12 NOTE — Anesthesia Postprocedure Evaluation (Signed)
Anesthesia Post Note  Patient: Linda Payne  Procedure(s) Performed: IRRIGATION AND DEBRIDEMENT EXTREMITY (Left ) CLOSED REDUCTION FINGER WITH PERCUTANEOUS PINNING (Left Thumb)     Patient location during evaluation: PACU Anesthesia Type: General Level of consciousness: awake and alert Pain management: pain level controlled Vital Signs Assessment: post-procedure vital signs reviewed and stable Respiratory status: spontaneous breathing, nonlabored ventilation and respiratory function stable Cardiovascular status: blood pressure returned to baseline and stable Postop Assessment: no apparent nausea or vomiting Anesthetic complications: no    Last Vitals:  Vitals:   03/12/19 1930 03/12/19 1945  BP: 116/80 116/73  Pulse: 80 76  Resp:    Temp:  36.8 C  SpO2: 96% 98%    Last Pain:  Vitals:   03/12/19 1915  TempSrc:   PainSc: 7                  Mckynzie Liwanag,W. EDMOND

## 2019-03-12 NOTE — Progress Notes (Signed)
[employer] video interpreter used to clarify pain score with pt.  Pt reports 7/10.  Pt a&ox4.  Will use the video interpreter again when going over discharge instructions.

## 2019-03-12 NOTE — ED Triage Notes (Signed)
Pt complains of injury to left thumb- pt accidentally cut with a knife. Thumb Is wrapped, bleeding controlled.

## 2019-03-12 NOTE — ED Notes (Signed)
Ortho at bedside.

## 2019-03-12 NOTE — Op Note (Signed)
Operative procedure March 12, 2019  Roseanne Kaufman MD  Preoperative diagnosis laceration left thumb with extensor pollicis longus tendon injury and open interphalangeal joint  Postop diagnosis the same  Operative procedure #1 irrigation debridement open IP joint with tendon laceration left thumb status post knife injury #2 pinning/fixation interphalangeal joint with 0.045 Kirschner wire left thumb #3 stress radiography left thumb #4 extensor pollicis longus tendon repair left thumb  Surgeon Roseanne Kaufman  Anesthesia General  Estimated blood loss minimal  Complications none  Procedure in detail: Patient was seen by myself and anesthesia she was counseled extensively preoperatively and underwent a smooth induction of general anesthesia.  Following this she was prepped with Hibiclens prescrub followed by 10-minute surgical Betadine scrub and paint.  Once this was complete we performed a timeout and preoperative Ancef was given 2 g patient tolerated this nicely.  Once this was complete the patient then underwent a very careful and cautious irrigation and debridement of skin subcutaneous tissue tendon and interphalangeal joint which was open.  3 L were placed through and through the wound.  Following this I then made an incision at the tip of the thumb introduced a Kirschner wire and pin the interphalangeal joint in extension.  The pin was buried and incision was closed.  This will be removed at 6 to 7 weeks.  Following this attention was redirected towards the tendon where a modified Kessler  stitch was placed x2 for a 4 strand repair with additional imbrication in a box stitch.  This was an extensor pollicis longus tendon repair.  Following this additional irrigation was applied followed by tourniquet deflation which was up for less than 30 to 40 minutes.  Once this was complete the patient then underwent closure of the wound with Prolene and thumb spica splint was applied with standard  sterile dressing.  We will continue immobilization for 6 to 7 weeks and then remove the K wire in the office.  All questions have been encouraged and answered.  It was a pleasure to see the patient today.  Waldemar Siegel MD

## 2019-03-12 NOTE — Transfer of Care (Signed)
Immediate Anesthesia Transfer of Care Note  Patient: Linda Payne  Procedure(s) Performed: IRRIGATION AND DEBRIDEMENT EXTREMITY (Left ) CLOSED REDUCTION FINGER WITH PERCUTANEOUS PINNING (Left Thumb)  Patient Location: PACU  Anesthesia Type:General  Level of Consciousness: awake and alert   Airway & Oxygen Therapy: Patient Spontanous Breathing  Post-op Assessment: Report given to RN and Post -op Vital signs reviewed and stable  Post vital signs: Reviewed and stable  Last Vitals:  Vitals Value Taken Time  BP 128/83 03/12/19 1907  Temp    Pulse 93 03/12/19 1907  Resp    SpO2 98 % 03/12/19 1907  Vitals shown include unvalidated device data.  Last Pain:  Vitals:   03/12/19 1309  TempSrc: Oral         Complications: No apparent anesthesia complications

## 2019-03-12 NOTE — Consult Note (Signed)
Reason for Consult:Left thumb lac Referring Physician: R SEIDY LABRECK is an 33 y.o. female.  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 Linda Payne came to the ED for evaluation and was noted to have no distal extensor function and hand surgery was consulted. Linda Payne is RHD.  History reviewed. No pertinent past medical history.  History reviewed. No pertinent surgical history.  Family History  Problem Relation Age of Onset  . Hypertension Mother   . Diabetes Mother     Social History:  reports that Linda Payne has never smoked. Linda Payne has never used smokeless tobacco. Linda Payne reports that Linda Payne does not drink alcohol or use drugs.  Allergies: No Known Allergies  Medications: I have reviewed the patient's current medications.  No results found for this or any previous visit (from the past 48 hour(s)).  No results found.  Review of Systems  Constitutional: Negative for weight loss.  HENT: Negative for ear discharge, ear pain, hearing loss and tinnitus.   Eyes: Negative for blurred vision, double vision, photophobia and pain.  Respiratory: Negative for cough, sputum production and shortness of breath.   Cardiovascular: Negative for chest pain.  Gastrointestinal: Negative for abdominal pain, nausea and vomiting.  Genitourinary: Negative for dysuria, flank pain, frequency and urgency.  Musculoskeletal: Positive for joint pain (Left thumb). Negative for back pain, falls, myalgias and neck pain.  Neurological: Negative for dizziness, tingling, sensory change, focal weakness, loss of consciousness and headaches.  Endo/Heme/Allergies: Does not bruise/bleed easily.  Psychiatric/Behavioral: Negative for depression, memory loss and substance abuse. The patient is not nervous/anxious.    Blood pressure 129/86, pulse 69, temperature 98.1 F (36.7 C), temperature source Oral, resp. rate 15, SpO2 100 %, unknown if currently breastfeeding. Physical Exam  Constitutional:  Linda Payne appears well-developed and well-nourished. No distress.  HENT:  Head: Normocephalic and atraumatic.  Eyes: Conjunctivae are normal. Right eye exhibits no discharge. Left eye exhibits no discharge. No scleral icterus.  Neck: Normal range of motion.  Cardiovascular: Normal rate and regular rhythm.  Respiratory: Effort normal. No respiratory distress.  Musculoskeletal:     Comments: Left shoulder, elbow, wrist, digits- diagonal lac over dorsal DIP joint thumb, unable to extend, digital block intact, no instability, no blocks to motion  Sens  Ax/R/M/U intact  Mot   Ax/ R/ PIN/ M/ AIN/ U intact  Rad 2+  Neurological: Linda Payne is alert.  Skin: Skin is warm and dry. Linda Payne is not diaphoretic.  Psychiatric: Linda Payne has a normal mood and affect. Her behavior is normal.    Assessment/Plan: Left thumb lac with extensor tendon injury -- Will take to OR for I&D, tendon repair, and CRPP by Dr. Amedeo Plenty. NPO until then. Anticipate discharge home after surgery.    Lisette Abu, PA-C Orthopedic Surgery [phone] 03/12/2019, 4:06 PM

## 2019-03-12 NOTE — Anesthesia Procedure Notes (Signed)
Procedure Name: Intubation Date/Time: 03/12/2019 6:15 PM Performed by: Moshe Salisbury, CRNA Pre-anesthesia Checklist: Patient identified, Emergency Drugs available, Suction available and Patient being monitored Patient Re-evaluated:Patient Re-evaluated prior to induction Oxygen Delivery Method: Circle System Utilized Preoxygenation: Pre-oxygenation with 100% oxygen Induction Type: IV induction, Rapid sequence and Cricoid Pressure applied Laryngoscope Size: Mac and 3 Grade View: Grade I Tube type: Oral Tube size: 7.5 mm Number of attempts: 1 Airway Equipment and Method: Stylet Placement Confirmation: ETT inserted through vocal cords under direct vision,  positive ETCO2 and breath sounds checked- equal and bilateral Secured at: 20 cm Tube secured with: Tape Dental Injury: Teeth and Oropharynx as per pre-operative assessment

## 2019-03-12 NOTE — Discharge Instructions (Signed)
Keep your bandage clean and dry.  Do not get your bandage wet.  We recommend elevation.  You may move your fingers but keep your thumb still.  Your operation went very well.  We are able to repair your tendon.  Please call us if you have any problems.

## 2019-03-12 NOTE — Anesthesia Preprocedure Evaluation (Signed)
Anesthesia Evaluation  Patient identified by MRN, date of birth, ID band Patient awake    Reviewed: Allergy & Precautions, H&P , NPO status , Patient's Chart, lab work & pertinent test results  Airway Mallampati: II  TM Distance: >3 FB Neck ROM: Full    Dental no notable dental hx. (+) Teeth Intact, Dental Advisory Given   Pulmonary neg pulmonary ROS,    Pulmonary exam normal breath sounds clear to auscultation       Cardiovascular negative cardio ROS   Rhythm:Regular Rate:Normal     Neuro/Psych negative neurological ROS  negative psych ROS   GI/Hepatic negative GI ROS, Neg liver ROS,   Endo/Other  negative endocrine ROS  Renal/GU negative Renal ROS  negative genitourinary   Musculoskeletal   Abdominal   Peds  Hematology negative hematology ROS (+)   Anesthesia Other Findings   Reproductive/Obstetrics negative OB ROS                             Anesthesia Physical Anesthesia Plan  ASA: I and emergent  Anesthesia Plan: General   Post-op Pain Management:    Induction: Intravenous, Rapid sequence and Cricoid pressure planned  PONV Risk Score and Plan: 4 or greater and Ondansetron, Dexamethasone and Midazolam  Airway Management Planned: Oral ETT  Additional Equipment:   Intra-op Plan:   Post-operative Plan: Extubation in OR  Informed Consent: I have reviewed the patients History and Physical, chart, labs and discussed the procedure including the risks, benefits and alternatives for the proposed anesthesia with the patient or authorized representative who has indicated his/her understanding and acceptance.     Dental advisory given  Plan Discussed with: CRNA  Anesthesia Plan Comments:         Anesthesia Quick Evaluation

## 2019-03-12 NOTE — ED Provider Notes (Signed)
[facility] Provider Note   CSN: [id number] Arrival date & time: 03/12/19  1258     History   Chief Complaint Chief Complaint  Patient presents with  . Finger Injury    HPI Linda Payne is a 33 y.o. female presents for evaluation of acute onset, persistent laceration to the dorsum of the left thumb.  She reports that approximately 2 hours prior to my assessment she was at work cutting chicken when she sustained a laceration to the dorsum of the left thumb overlying the left first DIP joint.  Notes a little bit of numbness and tingling to the distal fingertip and some pain surrounding the laceration site.  She is not on any blood thinners.  Her tetanus is up-to-date.  She is right-hand dominant.  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     The history is provided by the patient. The history is limited by a language barrier. A language interpreter was used.    History reviewed. No pertinent past medical history.  Patient Active Problem List   Diagnosis Date Noted  . SVD (spontaneous vaginal delivery) 01/22/2017  . ASCUS with positive high risk HPV cervical 01/22/2017  . Back pain 10/25/2012    History reviewed. No pertinent surgical history.   OB History    Gravida  1   Para  1   Term  1   Preterm      AB      Living  1     SAB      TAB      Ectopic      Multiple  0   Live Births  1            Home Medications    Prior to Admission medications   Medication Sig Start Date End Date Taking? Authorizing Provider  cephALEXin (KEFLEX) 500 MG capsule Take 1 capsule (500 mg total) by mouth 4 (four) times daily for 10 days. 03/12/19 03/22/19  Dominica SeverinGramig, William, MD  HYDROcodone-acetaminophen (NORCO) 5-325 MG tablet Take 1 tablet by mouth every 4 (four) hours as needed for moderate pain. 03/12/19   Dominica SeverinGramig, William, MD  ibuprofen (ADVIL,MOTRIN) 600 MG tablet Take 1  tablet (600 mg total) by mouth every 6 (six) hours. Patient not taking: Reported on 02/11/2018 01/24/17   Degele, Kandra NicolasJulie P, MD    Family History Family History  Problem Relation Age of Onset  . Hypertension Mother   . Diabetes Mother     Social History Social History   Tobacco Use  . Smoking status: Never Smoker  . Smokeless tobacco: Never Used  Substance Use Topics  . Alcohol use: No  . Drug use: No     Allergies   Patient has no known allergies.   Review of Systems Review of Systems  Skin: Positive for wound.  Neurological: Positive for numbness. Negative for weakness.  Hematological: Does not bruise/bleed easily.     Physical Exam Updated Vital Signs BP 116/73 (BP Location: Right Arm)   Pulse 76   Temp 98.3 F (36.8 C)   Resp 16   SpO2 98%   Physical Exam Vitals signs and nursing note reviewed.  Constitutional:      General: She is not in acute distress.    Appearance: She is well-developed.  HENT:     Head: Normocephalic and atraumatic.  Eyes:     General:        Right eye: No  discharge.        Left eye: No discharge.     Conjunctiva/sclera: Conjunctivae normal.  Neck:     Vascular: No JVD.     Trachea: No tracheal deviation.  Cardiovascular:     Rate and Rhythm: Normal rate.     Pulses: Normal pulses.     Comments: 2+ radial pulses bilaterally Pulmonary:     Effort: Pulmonary effort is normal.  Abdominal:     General: There is no distension.  Musculoskeletal:     Comments: 2.7cm linear laceration overlying the left first DIP joint, oozing small amount of blood.  Inability to extend thumb distally. 5/5 strength of the digit with flexion and extension against resistance.  No involvement of the nail or nailbed.  Skin:    General: Skin is warm and dry.     Capillary Refill: Capillary refill takes less than 2 seconds.     Findings: No erythema.  Neurological:     Mental Status: She is alert.     Comments: Altered sensation to light touch of  the distal left thumb.  Psychiatric:        Behavior: Behavior normal.      ED Treatments / Results  Labs (all labs ordered are listed, but only abnormal results are displayed) Labs Reviewed  RESPIRATORY PANEL BY RT PCR (FLU A&B, COVID)  POCT PREGNANCY, URINE    EKG None  Radiology No results found.  Procedures .Marland KitchenLaceration Repair  Date/Time: 03/12/2019 8:17 PM Performed by: Jeanie Sewer, PA-C Authorized by: Jeanie Sewer, PA-C   Consent:    Consent obtained:  Verbal   Consent given by:  Patient   Risks discussed:  Infection, need for additional repair, pain, poor cosmetic result and poor wound healing   Alternatives discussed:  No treatment and delayed treatment Universal protocol:    Procedure explained and questions answered to patient or proxy's satisfaction: yes     Relevant documents present and verified: yes     Test results available and properly labeled: yes     Imaging studies available: yes     Required blood products, implants, devices, and special equipment available: yes     Site/side marked: yes     Immediately prior to procedure, a time out was called: yes     Patient identity confirmed:  Verbally with patient Anesthesia (see MAR for exact dosages):    Anesthesia method:  Local infiltration   Local anesthetic:  Lidocaine 1% w/o epi Laceration details:    Location:  Finger   Finger location:  L thumb   Length (cm):  2.7   Depth (mm):  3 Repair type:    Repair type:  Complex Pre-procedure details:    Preparation:  Patient was prepped and draped in usual sterile fashion Exploration:    Limited defect created (wound extended): no     Hemostasis achieved with:  Direct pressure   Wound exploration: wound explored through full range of motion     Wound extent: tendon damage     Tendon damage location:  Upper extremity   Upper extremity tendon damage location:  Finger extensor   Tendon damage extent:  Complete transection   Tendon repair plan:   Repair at this visit Treatment:    Area cleansed with:  Betadine and saline   Amount of cleaning:  Extensive   Irrigation solution:  Sterile saline   Irrigation method:  Pressure wash   Visualized foreign bodies/material removed: no   Comments:  Procedure terminated due to apparent tendon injury.   (including critical care time)  Medications Ordered in ED Medications  chlorhexidine (HIBICLENS) 4 % liquid 4 application (has no administration in time range)  povidone-iodine 10 % swab 2 application (has no administration in time range)  lactated ringers infusion ( Intravenous Anesthesia Volume Adjustment 03/12/19 1849)  HYDROcodone-acetaminophen (NORCO/VICODIN) 5-325 MG per tablet (has no administration in time range)  lidocaine (PF) (XYLOCAINE) 1 % injection 5 mL (5 mLs Intradermal Given by Other 03/12/19 1552)  ceFAZolin (ANCEF) IVPB 2g/100 mL premix (2 g Intravenous Given 03/12/19 1805)  ceFAZolin (ANCEF) 2-4 GM/100ML-% IVPB (  Override pull for Anesthesia 03/12/19 1805)  HYDROcodone-acetaminophen (NORCO/VICODIN) 5-325 MG per tablet 1 tablet (1 tablet Oral Given 03/12/19 1931)  propofol (DIPRIVAN) 10 mg/mL bolus/IV push (has no administration in time range)  fentaNYL (SUBLIMAZE) 250 MCG/5ML injection (has no administration in time range)  midazolam (VERSED) 2 MG/2ML injection (has no administration in time range)  lidocaine 20 MG/ML injection (has no administration in time range)  rocuronium bromide 100 MG/10ML SOSY (has no administration in time range)  phenylephrine 0.4-0.9 MG/10ML-% injection (has no administration in time range)  propofol (DIPRIVAN) 1000 MG/100ML infusion (has no administration in time range)  succinylcholine (ANECTINE) 200 MG/10ML syringe (has no administration in time range)  dexamethasone (DECADRON) 10 MG/ML injection (has no administration in time range)  ondansetron (ZOFRAN) 4 MG/2ML injection (has no administration in time range)     Initial Impression /  Assessment and Plan / ED Course  I have reviewed the triage vital signs and the nursing notes.  Pertinent labs & imaging results that were available during my care of the patient were reviewed by me and considered in my medical decision making (see chart for details).        Patient presenting for evaluation of laceration to the dorsum of the left thumb.  She is right-hand dominant.  She is afebrile, vital signs are stable.  She is up-to-date on her tetanus.  Paresthesias of the distal fingertip noted on examination however examination somewhat limited due to language barrier though a translator was used throughout the encounter.  Upon anesthetizing the wound and copiously irrigating with normal saline, I explored the wound and noted that she had sustained a tendon injury.  She is unable to extend the left thumb distally.  CONSULT:  Spoke with Charma Igo, PA with orthopedic surgery.  Plan for operative repair and I&D with Dr. Amanda Pea today and discharge home postoperatively.  Patient agrees with plan at this time.  Covid test is pending.  Final Clinical Impressions(s) / ED Diagnoses   Final diagnoses:  Laceration of left thumb without foreign body without damage to nail, initial encounter  Extensor tendon laceration of finger with open wound, initial encounter    ED Discharge Orders         Ordered    HYDROcodone-acetaminophen (NORCO) 5-325 MG tablet  Every 4 hours PRN     03/12/19 1914    cephALEXin (KEFLEX) 500 MG capsule  4 times daily     03/12/19 1914    Call MD / Call 911    Comments: If you experience chest pain or shortness of breath, CALL 911 and be transported to the hospital emergency room.  If you develope a fever above 101 F, pus (white drainage) or increased drainage or redness at the wound, or calf pain, call your surgeon's office.   03/12/19 1914    Diet - low sodium heart  healthy     03/12/19 1914    Constipation Prevention    Comments: Drink plenty of fluids.   Prune juice may be helpful.  You may use a stool softener, such as Colace (over the counter) 100 mg twice a day.  Use MiraLax (over the counter) for constipation as needed.   03/12/19 1914    Increase activity slowly as tolerated     03/12/19 1914           Debroah Baller 03/12/19 2022    Lucrezia Starch, MD 03/13/19 1351

## 2019-03-13 ENCOUNTER — Encounter (HOSPITAL_COMMUNITY): Payer: Self-pay | Admitting: Orthopedic Surgery

## 2019-03-25 ENCOUNTER — Encounter: Payer: Self-pay | Admitting: *Deleted

## 2019-04-22 ENCOUNTER — Encounter (HOSPITAL_BASED_OUTPATIENT_CLINIC_OR_DEPARTMENT_OTHER): Payer: Self-pay | Admitting: Orthopedic Surgery

## 2019-04-22 ENCOUNTER — Other Ambulatory Visit: Payer: Self-pay

## 2019-04-22 ENCOUNTER — Other Ambulatory Visit (HOSPITAL_COMMUNITY)
Admission: RE | Admit: 2019-04-22 | Discharge: 2019-04-22 | Disposition: A | Discharge status: Home / Self Care | Payer: Self-pay | Source: Ambulatory Visit | Attending: Orthopedic Surgery | Admitting: Orthopedic Surgery

## 2019-04-22 DIAGNOSIS — Z01812 Encounter for preprocedural laboratory examination: Secondary | ICD-10-CM | POA: Insufficient documentation

## 2019-04-22 DIAGNOSIS — Z20822 Contact with and (suspected) exposure to covid-19: Secondary | ICD-10-CM | POA: Insufficient documentation

## 2019-04-22 LAB — SARS CORONAVIRUS 2 (TAT 6-24 HRS): SARS Coronavirus 2: NEGATIVE

## 2019-04-23 ENCOUNTER — Other Ambulatory Visit: Payer: Self-pay

## 2019-04-23 ENCOUNTER — Encounter (HOSPITAL_BASED_OUTPATIENT_CLINIC_OR_DEPARTMENT_OTHER)
Admission: RE | Disposition: A | Discharge status: Home / Self Care | Payer: Self-pay | Source: Home / Self Care | Attending: Orthopedic Surgery

## 2019-04-23 ENCOUNTER — Ambulatory Visit (HOSPITAL_BASED_OUTPATIENT_CLINIC_OR_DEPARTMENT_OTHER): Payer: Self-pay | Admitting: Anesthesiology

## 2019-04-23 ENCOUNTER — Ambulatory Visit (HOSPITAL_BASED_OUTPATIENT_CLINIC_OR_DEPARTMENT_OTHER)
Admission: RE | Admit: 2019-04-23 | Discharge: 2019-04-23 | Disposition: A | Discharge status: Home / Self Care | Payer: Self-pay | Attending: Orthopedic Surgery | Admitting: Orthopedic Surgery

## 2019-04-23 ENCOUNTER — Encounter (HOSPITAL_BASED_OUTPATIENT_CLINIC_OR_DEPARTMENT_OTHER): Payer: Self-pay | Admitting: Orthopedic Surgery

## 2019-04-23 DIAGNOSIS — Z6835 Body mass index (BMI) 35.0-35.9, adult: Secondary | ICD-10-CM | POA: Insufficient documentation

## 2019-04-23 DIAGNOSIS — Z833 Family history of diabetes mellitus: Secondary | ICD-10-CM | POA: Insufficient documentation

## 2019-04-23 DIAGNOSIS — E669 Obesity, unspecified: Secondary | ICD-10-CM | POA: Insufficient documentation

## 2019-04-23 DIAGNOSIS — Z4589 Encounter for adjustment and management of other implanted devices: Secondary | ICD-10-CM | POA: Insufficient documentation

## 2019-04-23 DIAGNOSIS — Z8249 Family history of ischemic heart disease and other diseases of the circulatory system: Secondary | ICD-10-CM | POA: Insufficient documentation

## 2019-04-23 HISTORY — PX: HARDWARE REMOVAL: SHX979

## 2019-04-23 LAB — POCT PREGNANCY, URINE: Preg Test, Ur: NEGATIVE

## 2019-04-23 SURGERY — REMOVAL, HARDWARE
Anesthesia: General | Site: Thumb | Laterality: Left

## 2019-04-23 MED ORDER — LIDOCAINE HCL (CARDIAC) PF 100 MG/5ML IV SOSY
PREFILLED_SYRINGE | INTRAVENOUS | Status: DC | PRN
  Administered 2019-04-23: 40 mg via INTRAVENOUS

## 2019-04-23 MED ORDER — PROPOFOL 500 MG/50ML IV EMUL
INTRAVENOUS | Status: DC | PRN
  Administered 2019-04-23: 75 ug/kg/min via INTRAVENOUS

## 2019-04-23 MED ORDER — FENTANYL CITRATE (PF) 100 MCG/2ML IJ SOLN
INTRAMUSCULAR | Status: DC | PRN
  Administered 2019-04-23 (×2): 50 ug via INTRAVENOUS

## 2019-04-23 MED ORDER — ONDANSETRON HCL 4 MG/2ML IJ SOLN
INTRAMUSCULAR | Status: DC | PRN
  Administered 2019-04-23: 4 mg via INTRAVENOUS

## 2019-04-23 MED ORDER — KETOROLAC TROMETHAMINE 15 MG/ML IJ SOLN: 30.0000 mg | Freq: Once | INTRAMUSCULAR | Status: DC

## 2019-04-23 MED ORDER — LIDOCAINE HCL (PF) 1 % IJ SOLN
INTRAMUSCULAR | Status: AC
  Filled 2019-04-23: qty 5

## 2019-04-23 MED ORDER — MIDAZOLAM HCL 2 MG/2ML IJ SOLN
INTRAMUSCULAR | Status: AC
  Filled 2019-04-23: qty 2

## 2019-04-23 MED ORDER — LACTATED RINGERS IV SOLN: INTRAVENOUS | Status: DC

## 2019-04-23 MED ORDER — LIDOCAINE HCL (PF) 1 % IJ SOLN
INTRAMUSCULAR | Status: AC
  Filled 2019-04-23: qty 30

## 2019-04-23 MED ORDER — ACETAMINOPHEN 500 MG PO TABS
1000.0000 mg | ORAL_TABLET | Freq: Once | ORAL | Status: AC
  Administered 2019-04-23: 14:00:00 1000 mg via ORAL

## 2019-04-23 MED ORDER — PROMETHAZINE HCL 25 MG/ML IJ SOLN: 6.2500 mg | INTRAMUSCULAR | Status: DC | PRN

## 2019-04-23 MED ORDER — MIDAZOLAM HCL 2 MG/2ML IJ SOLN
INTRAMUSCULAR | Status: DC | PRN
  Administered 2019-04-23: 2 mg via INTRAVENOUS

## 2019-04-23 MED ORDER — FENTANYL CITRATE (PF) 100 MCG/2ML IJ SOLN
INTRAMUSCULAR | Status: AC
  Filled 2019-04-23: qty 2

## 2019-04-23 MED ORDER — OXYCODONE HCL 5 MG/5ML PO SOLN: 5.0000 mg | Freq: Once | ORAL | Status: DC | PRN

## 2019-04-23 MED ORDER — FENTANYL CITRATE (PF) 100 MCG/2ML IJ SOLN: 25.0000 ug | INTRAMUSCULAR | Status: DC | PRN

## 2019-04-23 MED ORDER — KETOROLAC TROMETHAMINE 30 MG/ML IJ SOLN
INTRAMUSCULAR | Status: AC
  Filled 2019-04-23: qty 1

## 2019-04-23 MED ORDER — KETOROLAC TROMETHAMINE 30 MG/ML IJ SOLN
30.0000 mg | Freq: Once | INTRAMUSCULAR | Status: AC
  Administered 2019-04-23: 17:00:00 30 mg via INTRAVENOUS

## 2019-04-23 MED ORDER — OXYCODONE HCL 5 MG PO TABS: 5.0000 mg | ORAL_TABLET | Freq: Once | ORAL | Status: DC | PRN

## 2019-04-23 MED ORDER — 0.9 % SODIUM CHLORIDE (POUR BTL) OPTIME
TOPICAL | Status: DC | PRN
  Administered 2019-04-23: 500 mL

## 2019-04-23 MED ORDER — PROPOFOL 10 MG/ML IV BOLUS
INTRAVENOUS | Status: DC | PRN
  Administered 2019-04-23: 30 mg via INTRAVENOUS
  Administered 2019-04-23: 20 mg via INTRAVENOUS

## 2019-04-23 MED ORDER — LIDOCAINE HCL (PF) 1 % IJ SOLN
INTRAMUSCULAR | Status: DC | PRN
  Administered 2019-04-23: 18 mL

## 2019-04-23 MED ORDER — ACETAMINOPHEN 500 MG PO TABS
ORAL_TABLET | ORAL | Status: AC
  Filled 2019-04-23: qty 2

## 2019-04-23 SURGICAL SUPPLY — 72 items
BLADE SURG 15 STRL LF DISP TIS (BLADE) ×2 IMPLANT
BLADE SURG 15 STRL SS (BLADE) ×2
BNDG COHESIVE 1X5 TAN STRL LF (GAUZE/BANDAGES/DRESSINGS) ×2 IMPLANT
BNDG COHESIVE 2X5 TAN STRL LF (GAUZE/BANDAGES/DRESSINGS) ×2 IMPLANT
BNDG COHESIVE 3X5 TAN STRL LF (GAUZE/BANDAGES/DRESSINGS) IMPLANT
BNDG CONFORM 2 STRL LF (GAUZE/BANDAGES/DRESSINGS) ×2 IMPLANT
BNDG CONFORM 3 STRL LF (GAUZE/BANDAGES/DRESSINGS) ×3 IMPLANT
BNDG ELASTIC 3X5.8 VLCR STR LF (GAUZE/BANDAGES/DRESSINGS) ×3 IMPLANT
BNDG ELASTIC 4X5.8 VLCR STR LF (GAUZE/BANDAGES/DRESSINGS) IMPLANT
BNDG GAUZE ELAST 4 BULKY (GAUZE/BANDAGES/DRESSINGS) ×1 IMPLANT
BRUSH SCRUB EZ  4% CHG (MISCELLANEOUS) ×2
BRUSH SCRUB EZ 4% CHG (MISCELLANEOUS) IMPLANT
BRUSH SCRUB EZ PLAIN DRY (MISCELLANEOUS) ×3 IMPLANT
CANISTER SUCT 1200ML W/VALVE (MISCELLANEOUS) IMPLANT
CLOSURE WOUND 1/2 X4 (GAUZE/BANDAGES/DRESSINGS)
CORD BIPOLAR FORCEPS 12FT (ELECTRODE) ×3 IMPLANT
COVER BACK TABLE 60X90IN (DRAPES) ×3 IMPLANT
COVER WAND RF STERILE (DRAPES) IMPLANT
CUFF TOURN SGL QUICK 18X4 (TOURNIQUET CUFF) IMPLANT
DECANTER SPIKE VIAL GLASS SM (MISCELLANEOUS) IMPLANT
DRAIN TLS ROUND 10FR (DRAIN) IMPLANT
DRAPE EXTREMITY T 121X128X90 (DISPOSABLE) ×3 IMPLANT
DRAPE HALF SHEET 70X43 (DRAPES) ×3 IMPLANT
DRAPE OEC MINIVIEW 54X84 (DRAPES) ×2 IMPLANT
DRAPE SURG 17X23 STRL (DRAPES) ×3 IMPLANT
DRSG EMULSION OIL 3X3 NADH (GAUZE/BANDAGES/DRESSINGS) ×3 IMPLANT
GAUZE 4X4 16PLY RFD (DISPOSABLE) IMPLANT
GAUZE XEROFORM 1X8 LF (GAUZE/BANDAGES/DRESSINGS) ×2 IMPLANT
GLOVE BIO SURGEON STRL SZ8 (GLOVE) ×3 IMPLANT
GLOVE BIOGEL M STRL SZ7.5 (GLOVE) IMPLANT
GLOVE BIOGEL PI IND STRL 6.5 (GLOVE) IMPLANT
GLOVE BIOGEL PI IND STRL 7.0 (GLOVE) IMPLANT
GLOVE BIOGEL PI INDICATOR 6.5 (GLOVE) ×2
GLOVE BIOGEL PI INDICATOR 7.0 (GLOVE) ×2
GLOVE ECLIPSE 6.5 STRL STRAW (GLOVE) ×2 IMPLANT
GLOVE SS BIOGEL STRL SZ 8 (GLOVE) ×1 IMPLANT
GLOVE SUPERSENSE BIOGEL SZ 8 (GLOVE) ×2
GOWN STRL REUS W/ TWL LRG LVL3 (GOWN DISPOSABLE) ×1 IMPLANT
GOWN STRL REUS W/ TWL XL LVL3 (GOWN DISPOSABLE) ×1 IMPLANT
GOWN STRL REUS W/TWL LRG LVL3 (GOWN DISPOSABLE) ×2
GOWN STRL REUS W/TWL XL LVL3 (GOWN DISPOSABLE) ×2
NDL HYPO 25X1 1.5 SAFETY (NEEDLE) IMPLANT
NEEDLE HYPO 22GX1.5 SAFETY (NEEDLE) ×2 IMPLANT
NEEDLE HYPO 25X1 1.5 SAFETY (NEEDLE) ×3 IMPLANT
NS IRRIG 1000ML POUR BTL (IV SOLUTION) ×3 IMPLANT
PACK BASIN DAY SURGERY FS (CUSTOM PROCEDURE TRAY) ×3 IMPLANT
PAD ALCOHOL SWAB (MISCELLANEOUS) ×20 IMPLANT
PAD CAST 3X4 CTTN HI CHSV (CAST SUPPLIES) ×1 IMPLANT
PAD CAST 4YDX4 CTTN HI CHSV (CAST SUPPLIES) IMPLANT
PADDING CAST ABS 3INX4YD NS (CAST SUPPLIES)
PADDING CAST ABS 4INX4YD NS (CAST SUPPLIES)
PADDING CAST ABS COTTON 3X4 (CAST SUPPLIES) ×1 IMPLANT
PADDING CAST ABS COTTON 4X4 ST (CAST SUPPLIES) ×1 IMPLANT
PADDING CAST COTTON 3X4 STRL (CAST SUPPLIES) ×2
PADDING CAST COTTON 4X4 STRL (CAST SUPPLIES)
SPLINT FINGER 4.25 BULB 911906 (SOFTGOODS) ×2 IMPLANT
SPLINT PLASTER CAST XFAST 3X15 (CAST SUPPLIES) IMPLANT
SPLINT PLASTER CAST XFAST 4X15 (CAST SUPPLIES) IMPLANT
SPLINT PLASTER XTRA FAST SET 4 (CAST SUPPLIES)
SPLINT PLASTER XTRA FASTSET 3X (CAST SUPPLIES)
STOCKINETTE 4X48 STRL (DRAPES) ×3 IMPLANT
STRIP CLOSURE SKIN 1/2X4 (GAUZE/BANDAGES/DRESSINGS) IMPLANT
SUCTION FRAZIER HANDLE 10FR (MISCELLANEOUS)
SUCTION TUBE FRAZIER 10FR DISP (MISCELLANEOUS) IMPLANT
SUT CHROMIC 5 0 P 3 (SUTURE) ×2 IMPLANT
SUT PROLENE 4 0 PS 2 18 (SUTURE) ×1 IMPLANT
SYR BULB 3OZ (MISCELLANEOUS) ×3 IMPLANT
SYR CONTROL 10ML LL (SYRINGE) ×4 IMPLANT
TOWEL GREEN STERILE FF (TOWEL DISPOSABLE) ×6 IMPLANT
TUBE CONNECTING 20'X1/4 (TUBING)
TUBE CONNECTING 20X1/4 (TUBING) IMPLANT
UNDERPAD 30X36 HEAVY ABSORB (UNDERPADS AND DIAPERS) ×3 IMPLANT

## 2019-04-23 NOTE — Op Note (Addendum)
Operative note April 23, 2019  Preoperative diagnosis EPL laceration/extensor laceration status post repair and joint pinning patient presents for deep hardware removal.  Postop diagnosis: The same.  Operative procedure #1 deep hardware removal left thumb #2 4 view radiographic series left thumb  Surgeon Dominica Severin  Assistant none  Anesthesia local with IV sedation  Estimated blood loss minimal  Indications patient status post extensor pollicis longus tendon repair and joint pinning with retained hardware she presents for surgical removal of the retained hardware.  I discussed with the patient and her family risk and benefits and she desires to proceed.  Operative procedure patient was seen by myself and anesthesia taken to the operative theater and underwent a smooth induction of intermetacarpal/field block by myself with lidocaine without epinephrine following this the patient was prepped and draped with 2 Hibiclens scrubs followed by 10-minute surgical Betadine scrub.  Following this patient then underwent a very careful and cautious approach to the extremity with incision at the tip of her thumb dissection was carried down and following this deep hardware removal was carried out about the tip of the thumb.  The hardware was removed without difficulty.  The joint was then supple and there is no retained fragments.  AP lateral and oblique x-rays were performed examined and interpreted by myself and looked to be excellent.  I was pleased with the joint contours and the findings radiographically.  We then irrigated once again and remove any de-epithelialized skin tissue.  Following this we performed a layered closure and proper dressing in the form of Xeroform gauze finger splint and wrap was placed.  I will see her back in 10 days.  She will notify me should any problems occur.  All questions have been encouraged and answered.  I discussed all issues with her family through the  interpreter at great length.   Marcquis Ridlon MD

## 2019-04-23 NOTE — Anesthesia Preprocedure Evaluation (Addendum)
Anesthesia Evaluation  Patient identified by MRN, date of birth, ID band Patient awake    Reviewed: Allergy & Precautions, NPO status , Patient's Chart, lab work & pertinent test results  History of Anesthesia Complications Negative for: history of anesthetic complications  Airway Mallampati: II  TM Distance: >3 FB Neck ROM: Full    Dental no notable dental hx.    Pulmonary neg pulmonary ROS,    Pulmonary exam normal        Cardiovascular negative cardio ROS Normal cardiovascular exam     Neuro/Psych negative neurological ROS  negative psych ROS   GI/Hepatic negative GI ROS, Neg liver ROS,   Endo/Other  Obese, BMI 35  Renal/GU negative Renal ROS  negative genitourinary   Musculoskeletal Status post extensor tendon repair left thumb with pinning   Abdominal   Peds  Hematology negative hematology ROS (+)   Anesthesia Other Findings Day of surgery medications reviewed with patient.  Reproductive/Obstetrics negative OB ROS                            Anesthesia Physical Anesthesia Plan  ASA: II  Anesthesia Plan: MAC   Post-op Pain Management:    Induction: Intravenous  PONV Risk Score and Plan: 3 and Treatment may vary due to age or medical condition, Ondansetron, Dexamethasone and Midazolam  Airway Management Planned: Natural Airway and Simple Face Mask  Additional Equipment: None  Intra-op Plan:   Post-operative Plan:   Informed Consent: I have reviewed the patients History and Physical, chart, labs and discussed the procedure including the risks, benefits and alternatives for the proposed anesthesia with the patient or authorized representative who has indicated his/her understanding and acceptance.     Dental advisory given  Plan Discussed with: CRNA  Anesthesia Plan Comments: (Spanish interpreter used for preop consent.)      Anesthesia Quick Evaluation

## 2019-04-23 NOTE — Discharge Instructions (Signed)
   Post Anesthesia Home Care Instructions  Activity: Get plenty of rest for the remainder of the day. A responsible individual must stay with you for 24 hours following the procedure.  For the next 24 hours, DO NOT: -Drive a car -Advertising copywriter -Drink alcoholic beverages -Take any medication unless instructed by your physician -Make any legal decisions or sign important papers.  Meals: Start with liquid foods such as gelatin or soup. Progress to regular foods as tolerated. Avoid greasy, spicy, heavy foods. If nausea and/or vomiting occur, drink only clear liquids until the nausea and/or vomiting subsides. Call your physician if vomiting continues.  Special Instructions/Symptoms: Your throat may feel dry or sore from the anesthesia or the breathing tube placed in your throat during surgery. If this causes discomfort, gargle with warm salt water. The discomfort should disappear within 24 hours.  If you had a scopolamine patch placed behind your ear for the management of post- operative nausea and/or vomiting:  1. The medication in the patch is effective for 72 hours, after which it should be removed.  Wrap patch in a tissue and discard in the trash. Wash hands thoroughly with soap and water. 2. You may remove the patch earlier than 72 hours if you experience unpleasant side effects which may include dry mouth, dizziness or visual disturbances. 3. Avoid touching the patch. Wash your hands with soap and water after contact with the patch.    No Ibuprofen until 11:00 pm

## 2019-04-23 NOTE — Anesthesia Postprocedure Evaluation (Addendum)
Anesthesia Post Note  Patient: Linda Payne  Procedure(s) Performed: Deep pin removal with layer closure (Left )     Patient location during evaluation: PACU Anesthesia Type: MAC Level of consciousness: awake and alert and oriented Pain management: pain level controlled Vital Signs Assessment: post-procedure vital signs reviewed and stable Respiratory status: spontaneous breathing, nonlabored ventilation and respiratory function stable Cardiovascular status: blood pressure returned to baseline Postop Assessment: no apparent nausea or vomiting Anesthetic complications: no    Last Vitals:  Vitals:   04/23/19 1341 04/23/19 1635  BP: 117/82 109/74  Pulse: 94 81  Resp: 18 19  Temp: 37.3 C 36.7 C  SpO2: 100% 100%    Last Pain:  Vitals:   04/23/19 1635  TempSrc:   PainSc: 0-No pain                 Brennan Bailey

## 2019-04-23 NOTE — Transfer of Care (Signed)
Immediate Anesthesia Transfer of Care Note  Patient: Linda Payne  Procedure(s) Performed: Deep pin removal with layer closure (Left )  Patient Location: PACU  Anesthesia Type:MAC  Level of Consciousness: awake, alert  and oriented  Airway & Oxygen Therapy: Patient Spontanous Breathing and Patient connected to face mask oxygen  Post-op Assessment: Report given to RN and Post -op Vital signs reviewed and stable  Post vital signs: Reviewed and stable  Last Vitals:  Vitals Value Taken Time  BP    Temp    Pulse 84 04/23/19 1634  Resp    SpO2 100 % 04/23/19 1634  Vitals shown include unvalidated device data.  Last Pain:  Vitals:   04/23/19 1341  TempSrc: Oral  PainSc: 0-No pain      Patients Stated Pain Goal: 3 (32/02/33 4356)  Complications: No apparent anesthesia complications

## 2019-04-23 NOTE — H&P (Signed)
Linda Payne is an 34 y.o. female.   Chief Complaint: Retained hardware left thumb status post reconstruction HPI: Patient presents for repair reconstruction and hardware removal left thumb.  She is status post surgical endeavors March 12, 2019.  We will plan for deep hardware removal and repair of structures as necessary.  She understands all issues.  Patient presents for evaluation and treatment of the of their upper extremity predicament. The patient denies neck, back, chest or  abdominal pain. The patient notes that they have no lower extremity problems. The patients primary complaint is noted. We are planning surgical care pathway for the upper extremity.  History reviewed. No pertinent past medical history.  Past Surgical History:  Procedure Laterality Date  . CLOSED REDUCTION FINGER WITH PERCUTANEOUS PINNING Left 03/12/2019   Procedure: CLOSED REDUCTION FINGER WITH PERCUTANEOUS PINNING;  Surgeon: Roseanne Kaufman, MD;  Location: [facility];  Service: Orthopedics;  Laterality: Left;  . I & D EXTREMITY Left 03/12/2019   Procedure: IRRIGATION AND DEBRIDEMENT EXTREMITY;  Surgeon: Roseanne Kaufman, MD;  Location: [facility];  Service: Orthopedics;  Laterality: Left;    Family History  Problem Relation Age of Onset  . Hypertension Mother   . Diabetes Mother    Social History:  reports that she has never smoked. She has never used smokeless tobacco. She reports that she does not drink alcohol or use drugs.  Allergies: No Known Allergies  Medications Prior to Admission  Medication Sig Dispense Refill  . HYDROcodone-acetaminophen (NORCO) 5-325 MG tablet Take 1 tablet by mouth every 4 (four) hours as needed for moderate pain. 40 tablet 0    Results for orders placed or performed during the hospital encounter of 04/23/19 (from the past 48 hour(s))  Pregnancy, urine POC     Status: None   Collection Time: 04/23/19  1:20 PM  Result Value Ref Range   Preg Test, Ur NEGATIVE NEGATIVE   Comment:        THE SENSITIVITY OF THIS METHODOLOGY IS >24 mIU/mL    No results found.  Review of Systems  Blood pressure 117/82, pulse 94, temperature 99.1 F (37.3 C), temperature source Oral, resp. rate 18, height 4\' 11"  (1.499 m), weight 69.3 kg, SpO2 100 %, not currently breastfeeding. Physical Exam  Retained hardware left thumb.  We will plan for deep hardware removal with repair reconstruction.  I discussed all issues plans and concerns.The patient is alert and oriented in no acute distress. The patient complains of pain in the affected upper extremity.  The patient is noted to have a normal HEENT exam. Lung fields show equal chest expansion and no shortness of breath. Abdomen exam is nontender without distention. Lower extremity examination does not show any fracture dislocation or blood clot symptoms. Pelvis is stable and the neck and back are stable and nontender. Assessment/Plan Patient and I discussed all issues.  We will plan for deep hardware removal and follow-up in my office in 2 weeks.  We are planning surgery for your upper extremity. The risk and benefits of surgery to include risk of bleeding, infection, anesthesia,  damage to normal structures and failure of the surgery to accomplish its intended goals of relieving symptoms and restoring function have been discussed in detail. With this in mind we plan to proceed. I have specifically discussed with the patient the pre-and postoperative regime and the dos and don'ts and risk and benefits in great detail. Risk and benefits of surgery also include risk of dystrophy(CRPS), chronic nerve pain, failure of  the healing process to go onto completion and other inherent risks of surgery The relavent the pathophysiology of the disease/injury process, as well as the alternatives for treatment and postoperative course of action has been discussed in great detail with the patient who desires to proceed.  We will do everything in our  power to help you (the patient) restore function to the upper extremity. It is a pleasure to see this patient today.   Oletta Cohn III, MD 04/23/2019, 3:49 PM

## 2019-04-24 ENCOUNTER — Encounter: Payer: Self-pay | Admitting: *Deleted

## 2019-04-24 NOTE — Addendum Note (Signed)
Addendum  created 04/24/19 2248 by Kaylyn Layer, MD   Clinical Note Signed

## 2019-08-13 ENCOUNTER — Emergency Department (HOSPITAL_COMMUNITY)
Admission: EM | Admit: 2019-08-13 | Discharge: 2019-08-13 | Disposition: A | Discharge status: Home / Self Care | Payer: No Typology Code available for payment source | Attending: Emergency Medicine | Admitting: Emergency Medicine

## 2019-08-13 ENCOUNTER — Emergency Department (HOSPITAL_COMMUNITY): Payer: No Typology Code available for payment source

## 2019-08-13 ENCOUNTER — Encounter (HOSPITAL_COMMUNITY): Payer: Self-pay | Admitting: Emergency Medicine

## 2019-08-13 DIAGNOSIS — R519 Headache, unspecified: Secondary | ICD-10-CM | POA: Insufficient documentation

## 2019-08-13 DIAGNOSIS — M545 Low back pain: Secondary | ICD-10-CM | POA: Insufficient documentation

## 2019-08-13 DIAGNOSIS — M25551 Pain in right hip: Secondary | ICD-10-CM | POA: Diagnosis not present

## 2019-08-13 DIAGNOSIS — M542 Cervicalgia: Secondary | ICD-10-CM | POA: Diagnosis not present

## 2019-08-13 DIAGNOSIS — M546 Pain in thoracic spine: Secondary | ICD-10-CM | POA: Diagnosis present

## 2019-08-13 LAB — POC URINE PREG, ED: Preg Test, Ur: NEGATIVE

## 2019-08-13 MED ORDER — LIDOCAINE 5 % EX PTCH
1.0000 | MEDICATED_PATCH | Freq: Once | CUTANEOUS | Status: DC
  Administered 2019-08-13: 1 via TRANSDERMAL
  Filled 2019-08-13: qty 1

## 2019-08-13 NOTE — ED Notes (Signed)
Transported to xray 
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
## 2019-08-13 NOTE — Discharge Instructions (Addendum)

## 2019-08-13 NOTE — ED Notes (Signed)
Patient Alert and oriented to baseline. Stable and ambulatory to baseline. Patient verbalized understanding of the discharge instructions.  Patient belongings were taken by the patient.   
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
## 2019-09-04 ENCOUNTER — Emergency Department (HOSPITAL_COMMUNITY): Payer: No Typology Code available for payment source

## 2019-09-04 ENCOUNTER — Emergency Department (HOSPITAL_COMMUNITY)
Admission: EM | Admit: 2019-09-04 | Discharge: 2019-09-04 | Disposition: A | Discharge status: Home / Self Care | Payer: No Typology Code available for payment source | Attending: Emergency Medicine | Admitting: Emergency Medicine

## 2019-09-04 ENCOUNTER — Encounter (HOSPITAL_COMMUNITY): Payer: Self-pay | Admitting: Emergency Medicine

## 2019-09-04 DIAGNOSIS — M5442 Lumbago with sciatica, left side: Secondary | ICD-10-CM | POA: Diagnosis not present

## 2019-09-04 DIAGNOSIS — M5441 Lumbago with sciatica, right side: Secondary | ICD-10-CM | POA: Diagnosis not present

## 2019-09-04 DIAGNOSIS — G8921 Chronic pain due to trauma: Secondary | ICD-10-CM | POA: Insufficient documentation

## 2019-09-04 DIAGNOSIS — G8929 Other chronic pain: Secondary | ICD-10-CM

## 2019-09-04 DIAGNOSIS — R222 Localized swelling, mass and lump, trunk: Secondary | ICD-10-CM | POA: Diagnosis not present

## 2019-09-04 DIAGNOSIS — M545 Low back pain: Secondary | ICD-10-CM | POA: Diagnosis present

## 2019-09-04 LAB — POC URINE PREG, ED: Preg Test, Ur: NEGATIVE

## 2019-09-04 MED ORDER — KETOROLAC TROMETHAMINE 60 MG/2ML IM SOLN
60.0000 mg | Freq: Once | INTRAMUSCULAR | Status: AC
  Administered 2019-09-04: 60 mg via INTRAMUSCULAR
  Filled 2019-09-04: qty 2

## 2019-09-04 MED ORDER — IBUPROFEN 800 MG PO TABS: 800.0000 mg | ORAL_TABLET | Freq: Three times a day (TID) | ORAL | 0 refills | Status: AC

## 2019-09-04 MED ORDER — IBUPROFEN 800 MG PO TABS: 800.0000 mg | ORAL_TABLET | Freq: Three times a day (TID) | ORAL | 0 refills | Status: DC
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
## 2019-09-04 NOTE — Discharge Instructions (Addendum)
Your work-up was overall reassuring today.  It did not show any fractures in your low back.  It did show some inflammation in your pelvis, but this can be treated with anti-inflammatories like ibuprofen.  Please take the prescribed ibuprofen for pain.  I have also provided some stretching exercises that can help your pain as well.  You may use a heating pad or ice depending on what helps you more.  Please call the phone number on your discharge paperwork to establish with a family doctor.  If you cannot establish with a family doctor, please call the phone number provided on your discharge paperwork for [facility] health and wellness.  If your symptoms do not improve, you need to follow-up with them.  Return to the ER if your symptoms worsen.
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
## 2019-09-04 NOTE — ED Notes (Signed)
Pt transported to xray 

## 2019-09-04 NOTE — ED Notes (Signed)
Pt transported to MRI 

## 2019-09-04 NOTE — ED Notes (Signed)
RN used interpreter to go over d/c instructions. Pt verbalized understanding of discharge instructions. Follow up care, prescriptions and pain management reviewed. Pt had no further questions.
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
